# Patient Record
Sex: Male | Born: 1960 | Race: White | Hispanic: No | Marital: Married | Smoking: Never smoker
Health system: Southern US, Community
[De-identification: ages and names within clinical notes are randomized; demographics above are authoritative.]

## PROBLEM LIST (undated history)

## (undated) DIAGNOSIS — E785 Hyperlipidemia, unspecified: Secondary | ICD-10-CM

## (undated) DIAGNOSIS — Z8249 Family history of ischemic heart disease and other diseases of the circulatory system: Secondary | ICD-10-CM

## (undated) DIAGNOSIS — I10 Essential (primary) hypertension: Secondary | ICD-10-CM

## (undated) DIAGNOSIS — C44311 Basal cell carcinoma of skin of nose: Secondary | ICD-10-CM

## (undated) DIAGNOSIS — R079 Chest pain, unspecified: Secondary | ICD-10-CM

## (undated) DIAGNOSIS — A630 Anogenital (venereal) warts: Secondary | ICD-10-CM

## (undated) DIAGNOSIS — K429 Umbilical hernia without obstruction or gangrene: Secondary | ICD-10-CM

## (undated) DIAGNOSIS — K5520 Angiodysplasia of colon without hemorrhage: Secondary | ICD-10-CM

## (undated) DIAGNOSIS — G4730 Sleep apnea, unspecified: Secondary | ICD-10-CM

## (undated) HISTORY — DX: Basal cell carcinoma of skin of nose: C44.311

## (undated) HISTORY — DX: Angiodysplasia of colon without hemorrhage: K55.20

## (undated) HISTORY — DX: Chest pain, unspecified: R07.9

## (undated) HISTORY — PX: KNEE ARTHROSCOPY: SUR90

## (undated) HISTORY — DX: Essential (primary) hypertension: I10

## (undated) HISTORY — DX: Anogenital (venereal) warts: A63.0

## (undated) HISTORY — PX: TOOTH EXTRACTION: SUR596

## (undated) HISTORY — DX: Sleep apnea, unspecified: G47.30

## (undated) HISTORY — PX: COLONOSCOPY: SHX174

## (undated) HISTORY — DX: Hyperlipidemia, unspecified: E78.5

## (undated) HISTORY — PX: MOHS SURGERY: SUR867

## (undated) HISTORY — DX: Umbilical hernia without obstruction or gangrene: K42.9

## (undated) HISTORY — PX: MOUTH SURGERY: SHX715

## (undated) HISTORY — DX: Family history of ischemic heart disease and other diseases of the circulatory system: Z82.49

---

## 1985-05-12 HISTORY — PX: APPENDECTOMY: SHX54

## 2001-10-13 ENCOUNTER — Encounter: Payer: Self-pay | Admitting: Family Medicine

## 2001-10-13 ENCOUNTER — Encounter: Admission: RE | Admit: 2001-10-13 | Discharge: 2001-10-13 | Payer: Self-pay | Admitting: Family Medicine

## 2002-05-12 HISTORY — PX: HERNIA REPAIR: SHX51

## 2002-11-01 ENCOUNTER — Encounter: Payer: Self-pay | Admitting: Pulmonary Disease

## 2002-11-01 ENCOUNTER — Ambulatory Visit (HOSPITAL_BASED_OUTPATIENT_CLINIC_OR_DEPARTMENT_OTHER): Admission: RE | Admit: 2002-11-01 | Discharge: 2002-11-01 | Payer: Self-pay | Admitting: Family Medicine

## 2004-09-26 ENCOUNTER — Ambulatory Visit: Payer: Self-pay | Admitting: Family Medicine

## 2005-03-27 ENCOUNTER — Ambulatory Visit: Payer: Self-pay | Admitting: Family Medicine

## 2005-04-02 ENCOUNTER — Ambulatory Visit: Payer: Self-pay | Admitting: Family Medicine

## 2005-07-07 ENCOUNTER — Ambulatory Visit: Payer: Self-pay | Admitting: Family Medicine

## 2005-10-16 ENCOUNTER — Ambulatory Visit: Payer: Self-pay | Admitting: Family Medicine

## 2005-10-30 ENCOUNTER — Ambulatory Visit: Payer: Self-pay | Admitting: Internal Medicine

## 2005-11-24 ENCOUNTER — Ambulatory Visit: Payer: Self-pay | Admitting: Family Medicine

## 2005-12-04 ENCOUNTER — Ambulatory Visit: Payer: Self-pay | Admitting: Internal Medicine

## 2006-01-16 ENCOUNTER — Ambulatory Visit: Payer: Self-pay | Admitting: Family Medicine

## 2006-01-27 ENCOUNTER — Ambulatory Visit: Payer: Self-pay | Admitting: Family Medicine

## 2006-05-13 ENCOUNTER — Ambulatory Visit: Payer: Self-pay | Admitting: Family Medicine

## 2006-05-13 LAB — CONVERTED CEMR LAB
ALT: 52 units/L — ABNORMAL HIGH (ref 0–40)
AST: 45 units/L — ABNORMAL HIGH (ref 0–37)
Albumin: 4.2 g/dL (ref 3.5–5.2)
Alkaline Phosphatase: 52 units/L (ref 39–117)
Bilirubin, Direct: 0.2 mg/dL (ref 0.0–0.3)
Total Bilirubin: 1.8 mg/dL — ABNORMAL HIGH (ref 0.3–1.2)
Total Protein: 6.7 g/dL (ref 6.0–8.3)

## 2007-01-05 ENCOUNTER — Telehealth (INDEPENDENT_AMBULATORY_CARE_PROVIDER_SITE_OTHER): Payer: Self-pay | Admitting: *Deleted

## 2007-02-02 ENCOUNTER — Ambulatory Visit: Payer: Self-pay | Admitting: Family Medicine

## 2007-02-02 DIAGNOSIS — I10 Essential (primary) hypertension: Secondary | ICD-10-CM

## 2007-02-02 DIAGNOSIS — C44309 Unspecified malignant neoplasm of skin of other parts of face: Secondary | ICD-10-CM | POA: Insufficient documentation

## 2007-02-02 DIAGNOSIS — C44300 Unspecified malignant neoplasm of skin of unspecified part of face: Secondary | ICD-10-CM | POA: Insufficient documentation

## 2007-02-02 DIAGNOSIS — K429 Umbilical hernia without obstruction or gangrene: Secondary | ICD-10-CM | POA: Insufficient documentation

## 2007-02-02 DIAGNOSIS — E785 Hyperlipidemia, unspecified: Secondary | ICD-10-CM | POA: Insufficient documentation

## 2007-02-02 DIAGNOSIS — A630 Anogenital (venereal) warts: Secondary | ICD-10-CM

## 2007-02-02 LAB — CONVERTED CEMR LAB
Bilirubin Urine: NEGATIVE
Blood in Urine, dipstick: NEGATIVE
Glucose, Urine, Semiquant: NEGATIVE
Ketones, urine, test strip: NEGATIVE
Nitrite: NEGATIVE
Protein, U semiquant: NEGATIVE
Specific Gravity, Urine: 1.02
Urobilinogen, UA: NEGATIVE
WBC Urine, dipstick: NEGATIVE
pH: 5

## 2007-02-10 LAB — CONVERTED CEMR LAB
ALT: 37 units/L (ref 0–53)
AST: 34 units/L (ref 0–37)
Albumin: 4.3 g/dL (ref 3.5–5.2)
Alkaline Phosphatase: 55 units/L (ref 39–117)
BUN: 15 mg/dL (ref 6–23)
Basophils Absolute: 0.1 10*3/uL (ref 0.0–0.1)
Basophils Relative: 0.9 % (ref 0.0–1.0)
Bilirubin, Direct: 0.2 mg/dL (ref 0.0–0.3)
CO2: 28 meq/L (ref 19–32)
CRP, High Sensitivity: <1 — ABNORMAL LOW (ref 0.00–5.00)
Calcium: 9.2 mg/dL (ref 8.4–10.5)
Chloride: 109 meq/L (ref 96–112)
Cholesterol: 129 mg/dL (ref 0–200)
Creatinine, Ser: 0.8 mg/dL (ref 0.4–1.5)
Eosinophils Absolute: 0.2 10*3/uL (ref 0.0–0.6)
Eosinophils Relative: 2.2 % (ref 0.0–5.0)
GFR calc Af Amer: 133 mL/min
GFR calc non Af Amer: 110 mL/min
Glucose, Bld: 87 mg/dL (ref 70–99)
HCT: 44.8 % (ref 39.0–52.0)
HDL: 32.4 mg/dL — ABNORMAL LOW (ref >39.0)
Hemoglobin: 15.7 g/dL (ref 13.0–17.0)
Homocysteine: 10.9 micromoles/L (ref 5.00–13.90)
LDL Cholesterol: 73 mg/dL (ref 0–99)
Lymphocytes Relative: 29.3 % (ref 12.0–46.0)
MCHC: 35 g/dL (ref 30.0–36.0)
MCV: 93.8 fL (ref 78.0–100.0)
Monocytes Absolute: 0.7 10*3/uL (ref 0.2–0.7)
Monocytes Relative: 8 % (ref 3.0–11.0)
Neutro Abs: 4.9 10*3/uL (ref 1.4–7.7)
Neutrophils Relative %: 59.6 % (ref 43.0–77.0)
PSA: 0.64 ng/mL (ref 0.10–4.00)
Platelets: 197 10*3/uL (ref 150–400)
Potassium: 4.1 meq/L (ref 3.5–5.1)
RBC: 4.78 M/uL (ref 4.22–5.81)
RDW: 12.1 % (ref 11.5–14.6)
Sodium: 144 meq/L (ref 135–145)
TSH: 0.98 microintl units/mL (ref 0.35–5.50)
Total Bilirubin: 1.5 mg/dL — ABNORMAL HIGH (ref 0.3–1.2)
Total CHOL/HDL Ratio: 4
Total Protein: 6.9 g/dL (ref 6.0–8.3)
Triglycerides: 120 mg/dL (ref 0–149)
VLDL: 24 mg/dL (ref 0–40)
WBC: 8.3 10*3/uL (ref 4.5–10.5)

## 2007-02-11 ENCOUNTER — Encounter: Payer: Self-pay | Admitting: Family Medicine

## 2007-02-15 ENCOUNTER — Telehealth (INDEPENDENT_AMBULATORY_CARE_PROVIDER_SITE_OTHER): Payer: Self-pay | Admitting: *Deleted

## 2007-02-23 ENCOUNTER — Encounter: Payer: Self-pay | Admitting: Family Medicine

## 2007-02-23 ENCOUNTER — Ambulatory Visit: Payer: Self-pay

## 2007-03-19 ENCOUNTER — Telehealth (INDEPENDENT_AMBULATORY_CARE_PROVIDER_SITE_OTHER): Payer: Self-pay | Admitting: *Deleted

## 2007-06-22 ENCOUNTER — Ambulatory Visit: Payer: Self-pay | Admitting: Family Medicine

## 2007-07-02 LAB — CONVERTED CEMR LAB
ALT: 40 units/L (ref 0–53)
AST: 32 units/L (ref 0–37)
Albumin: 4 g/dL (ref 3.5–5.2)
Alkaline Phosphatase: 64 units/L (ref 39–117)
Bilirubin, Direct: 0.2 mg/dL (ref 0.0–0.3)
Cholesterol: 108 mg/dL (ref 0–200)
HDL: 29.2 mg/dL — ABNORMAL LOW (ref >39.0)
LDL Cholesterol: 54 mg/dL (ref 0–99)
Total Bilirubin: 0.7 mg/dL (ref 0.3–1.2)
Total CHOL/HDL Ratio: 3.7
Total Protein: 6.9 g/dL (ref 6.0–8.3)
Triglycerides: 122 mg/dL (ref 0–149)
VLDL: 24 mg/dL (ref 0–40)

## 2008-02-14 ENCOUNTER — Ambulatory Visit: Payer: Self-pay | Admitting: Family Medicine

## 2008-02-24 ENCOUNTER — Encounter (INDEPENDENT_AMBULATORY_CARE_PROVIDER_SITE_OTHER): Payer: Self-pay | Admitting: *Deleted

## 2008-04-05 ENCOUNTER — Telehealth (INDEPENDENT_AMBULATORY_CARE_PROVIDER_SITE_OTHER): Payer: Self-pay | Admitting: *Deleted

## 2009-02-12 ENCOUNTER — Encounter: Payer: Self-pay | Admitting: Family Medicine

## 2009-02-14 ENCOUNTER — Ambulatory Visit: Payer: Self-pay | Admitting: Family Medicine

## 2009-02-20 ENCOUNTER — Encounter: Payer: Self-pay | Admitting: Family Medicine

## 2009-02-27 ENCOUNTER — Encounter: Payer: Self-pay | Admitting: Family Medicine

## 2009-03-20 ENCOUNTER — Ambulatory Visit: Payer: Self-pay | Admitting: Family Medicine

## 2009-03-20 LAB — CONVERTED CEMR LAB
OCCULT 1: NEGATIVE
OCCULT 2: NEGATIVE
OCCULT 3: NEGATIVE

## 2009-05-25 ENCOUNTER — Ambulatory Visit: Payer: Self-pay | Admitting: Family Medicine

## 2009-06-04 LAB — CONVERTED CEMR LAB
ALT: 33 units/L (ref 0–53)
AST: 31 units/L (ref 0–37)
Albumin: 4.6 g/dL (ref 3.5–5.2)
Alkaline Phosphatase: 57 units/L (ref 39–117)
BUN: 19 mg/dL (ref 6–23)
Bilirubin, Direct: 0.1 mg/dL (ref 0.0–0.3)
CO2: 20 meq/L (ref 19–32)
Calcium: 10.1 mg/dL (ref 8.4–10.5)
Chloride: 105 meq/L (ref 96–112)
Cholesterol: 110 mg/dL (ref 0–200)
Creatinine, Ser: 0.83 mg/dL (ref 0.40–1.50)
Glucose, Bld: 93 mg/dL (ref 70–99)
HDL: 35 mg/dL — ABNORMAL LOW (ref >39)
Indirect Bilirubin: 0.9 mg/dL (ref 0.0–0.9)
LDL Cholesterol: 49 mg/dL (ref 0–99)
Potassium: 4.5 meq/L (ref 3.5–5.3)
Sodium: 141 meq/L (ref 135–145)
Total Bilirubin: 1 mg/dL (ref 0.3–1.2)
Total CHOL/HDL Ratio: 3.1
Total Protein: 7.3 g/dL (ref 6.0–8.3)
Triglycerides: 129 mg/dL (ref <150)
VLDL: 26 mg/dL (ref 0–40)

## 2010-02-25 ENCOUNTER — Encounter: Payer: Self-pay | Admitting: Family Medicine

## 2010-02-25 ENCOUNTER — Ambulatory Visit: Payer: Self-pay | Admitting: Family Medicine

## 2010-02-25 DIAGNOSIS — G4733 Obstructive sleep apnea (adult) (pediatric): Secondary | ICD-10-CM

## 2010-03-19 ENCOUNTER — Ambulatory Visit: Payer: Self-pay | Admitting: Family Medicine

## 2010-03-26 ENCOUNTER — Ambulatory Visit: Payer: Self-pay | Admitting: Pulmonary Disease

## 2010-04-07 ENCOUNTER — Encounter: Payer: Self-pay | Admitting: Pulmonary Disease

## 2010-04-07 ENCOUNTER — Ambulatory Visit (HOSPITAL_BASED_OUTPATIENT_CLINIC_OR_DEPARTMENT_OTHER)
Admission: RE | Admit: 2010-04-07 | Discharge: 2010-04-07 | Payer: Self-pay | Source: Home / Self Care | Admitting: Pulmonary Disease

## 2010-04-11 ENCOUNTER — Encounter: Payer: Self-pay | Admitting: Pulmonary Disease

## 2010-04-11 ENCOUNTER — Telehealth (INDEPENDENT_AMBULATORY_CARE_PROVIDER_SITE_OTHER): Payer: Self-pay | Admitting: *Deleted

## 2010-04-22 ENCOUNTER — Encounter: Payer: Self-pay | Admitting: Pulmonary Disease

## 2010-04-26 ENCOUNTER — Encounter: Payer: Self-pay | Admitting: Pulmonary Disease

## 2010-04-30 ENCOUNTER — Ambulatory Visit: Payer: Self-pay | Admitting: Pulmonary Disease

## 2010-05-02 ENCOUNTER — Encounter: Payer: Self-pay | Admitting: Pulmonary Disease

## 2010-05-26 ENCOUNTER — Encounter: Payer: Self-pay | Admitting: Pulmonary Disease

## 2010-06-04 ENCOUNTER — Ambulatory Visit: Admit: 2010-06-04 | Payer: Self-pay | Admitting: Pulmonary Disease

## 2010-06-07 ENCOUNTER — Ambulatory Visit
Admission: RE | Admit: 2010-06-07 | Discharge: 2010-06-07 | Payer: Self-pay | Source: Home / Self Care | Attending: Pulmonary Disease | Admitting: Pulmonary Disease

## 2010-06-09 LAB — CONVERTED CEMR LAB
ALT: 39 units/L (ref 0–53)
ALT: 44 units/L (ref 0–53)
AST: 35 units/L (ref 0–37)
AST: 38 units/L — ABNORMAL HIGH (ref 0–37)
Albumin: 4.4 g/dL (ref 3.5–5.2)
Albumin: 4.5 g/dL (ref 3.5–5.2)
Alkaline Phosphatase: 50 units/L (ref 39–117)
Alkaline Phosphatase: 53 units/L (ref 39–117)
BUN: 14 mg/dL (ref 6–23)
BUN: 16 mg/dL (ref 6–23)
Basophils Absolute: 0 10*3/uL (ref 0.0–0.1)
Basophils Absolute: 0 10*3/uL (ref 0.0–0.1)
Basophils Absolute: 0 10*3/uL (ref 0.0–0.1)
Basophils Relative: 0.3 % (ref 0.0–3.0)
Basophils Relative: 0.5 % (ref 0.0–3.0)
Basophils Relative: 0.6 % (ref 0.0–3.0)
Bilirubin Urine: NEGATIVE
Bilirubin, Direct: 0.1 mg/dL (ref 0.0–0.3)
Bilirubin, Direct: 0.2 mg/dL (ref 0.0–0.3)
Blood in Urine, dipstick: NEGATIVE
CO2: 29 meq/L (ref 19–32)
CO2: 30 meq/L (ref 19–32)
Calcium: 9.4 mg/dL (ref 8.4–10.5)
Calcium: 9.4 mg/dL (ref 8.4–10.5)
Chloride: 101 meq/L (ref 96–112)
Chloride: 106 meq/L (ref 96–112)
Cholesterol: 125 mg/dL (ref 0–200)
Creatinine, Ser: 0.8 mg/dL (ref 0.4–1.5)
Creatinine, Ser: 0.9 mg/dL (ref 0.4–1.5)
Eosinophils Absolute: 0.2 10*3/uL (ref 0.0–0.7)
Eosinophils Absolute: 0.2 10*3/uL (ref 0.0–0.7)
Eosinophils Absolute: 0.2 10*3/uL (ref 0.0–0.7)
Eosinophils Relative: 1.9 % (ref 0.0–5.0)
Eosinophils Relative: 2 % (ref 0.0–5.0)
Eosinophils Relative: 2.6 % (ref 0.0–5.0)
GFR calc Af Amer: 116 mL/min
GFR calc non Af Amer: 109.06 mL/min (ref >60)
GFR calc non Af Amer: 96 mL/min
Glucose, Bld: 109 mg/dL — ABNORMAL HIGH (ref 70–99)
Glucose, Bld: 90 mg/dL (ref 70–99)
Glucose, Urine, Semiquant: NEGATIVE
HCT: 44.8 % (ref 39.0–52.0)
HCT: 46 % (ref 39.0–52.0)
HCT: 46.2 % (ref 39.0–52.0)
HDL: 35.7 mg/dL — ABNORMAL LOW (ref >39.0)
Hemoglobin: 15.5 g/dL (ref 13.0–17.0)
Hemoglobin: 15.9 g/dL (ref 13.0–17.0)
Hemoglobin: 16.2 g/dL (ref 13.0–17.0)
Ketones, urine, test strip: NEGATIVE
LDL Cholesterol: 71 mg/dL (ref 0–99)
Lymphocytes Relative: 23.6 % (ref 12.0–46.0)
Lymphocytes Relative: 24.1 % (ref 12.0–46.0)
Lymphocytes Relative: 27.8 % (ref 12.0–46.0)
Lymphs Abs: 1.9 10*3/uL (ref 0.7–4.0)
Lymphs Abs: 2 10*3/uL (ref 0.7–4.0)
MCHC: 34.4 g/dL (ref 30.0–36.0)
MCHC: 34.5 g/dL (ref 30.0–36.0)
MCHC: 35.2 g/dL (ref 30.0–36.0)
MCV: 93.3 fL (ref 78.0–100.0)
MCV: 94.8 fL (ref 78.0–100.0)
MCV: 95.5 fL (ref 78.0–100.0)
Monocytes Absolute: 0.4 10*3/uL (ref 0.1–1.0)
Monocytes Absolute: 0.6 10*3/uL (ref 0.1–1.0)
Monocytes Absolute: 0.7 10*3/uL (ref 0.1–1.0)
Monocytes Relative: 4.2 % (ref 3.0–12.0)
Monocytes Relative: 7.9 % (ref 3.0–12.0)
Monocytes Relative: 8 % (ref 3.0–12.0)
Neutro Abs: 5.1 10*3/uL (ref 1.4–7.7)
Neutro Abs: 5.5 10*3/uL (ref 1.4–7.7)
Neutro Abs: 5.6 10*3/uL (ref 1.4–7.7)
Neutrophils Relative %: 64.9 % (ref 43.0–77.0)
Neutrophils Relative %: 65.4 % (ref 43.0–77.0)
Neutrophils Relative %: 66.2 % (ref 43.0–77.0)
Nitrite: NEGATIVE
PSA: 0.42 ng/mL (ref 0.10–4.00)
PSA: 0.96 ng/mL (ref 0.10–4.00)
PSA: 1.1 ng/mL (ref 0.10–4.00)
Platelets: 181 10*3/uL (ref 150.0–400.0)
Platelets: 197 10*3/uL (ref 150.0–400.0)
Platelets: 209 10*3/uL (ref 150–400)
Potassium: 4 meq/L (ref 3.5–5.1)
Potassium: 4.6 meq/L (ref 3.5–5.1)
Protein, U semiquant: NEGATIVE
RBC: 4.7 M/uL (ref 4.22–5.81)
RBC: 4.87 M/uL (ref 4.22–5.81)
RBC: 4.93 M/uL (ref 4.22–5.81)
RDW: 11.7 % (ref 11.5–14.6)
RDW: 11.9 % (ref 11.5–14.6)
RDW: 12.6 % (ref 11.5–14.6)
Sodium: 140 meq/L (ref 135–145)
Sodium: 142 meq/L (ref 135–145)
Specific Gravity, Urine: 1.025
TSH: 0.99 microintl units/mL (ref 0.35–5.50)
TSH: 1.1 microintl units/mL (ref 0.35–5.50)
TSH: 1.41 microintl units/mL (ref 0.35–5.50)
Total Bilirubin: 1.2 mg/dL (ref 0.3–1.2)
Total Bilirubin: 1.5 mg/dL — ABNORMAL HIGH (ref 0.3–1.2)
Total CHOL/HDL Ratio: 3.5
Total Protein: 7.1 g/dL (ref 6.0–8.3)
Total Protein: 7.1 g/dL (ref 6.0–8.3)
Triglycerides: 92 mg/dL (ref 0–149)
Urobilinogen, UA: NEGATIVE
VLDL: 18 mg/dL (ref 0–40)
WBC Urine, dipstick: NEGATIVE
WBC: 7.8 10*3/uL (ref 4.5–10.5)
WBC: 8.4 10*3/uL (ref 4.5–10.5)
WBC: 8.4 10*3/uL (ref 4.5–10.5)
pH: 5

## 2010-06-11 NOTE — Miscellaneous (Signed)
Summary: [facility]AP Supplies/[facility]  [facility] Supplies/[facility]   Imported By: Sherian Rein 04/17/2010 07:23:27  _____________________________________________________________________  External Attachment:    Type:   Image     Comment:   External Document

## 2010-06-11 NOTE — Progress Notes (Signed)
Summary: PSG results  Phone Note Outgoing Call   Call placed by: Comer Locket. Vassie Loll MD,  April 11, 2010 1:28 PM Reason for Call: Discuss lab or test results Summary of Call: pl let him know - PSG did show severe obstructive sleep apnea , CPAP Rx has been sent. Wil discuss more on FU visit CPAP 9 cm with medium full face mask, humidity, download in 4 weeks I called both nos listed Initial call taken by: Comer Locket. Vassie Loll MD,  April 11, 2010 1:32 PM  Follow-up for Phone Call        [facility] x 1. Zackery Barefoot CMA  April 11, 2010 1:59 PM   Spoke with pt and notified of results/recs per RA.  Pt verbalized understanding. Follow-up by: Vernie Murders,  April 11, 2010 2:38 PM

## 2010-06-11 NOTE — Assessment & Plan Note (Signed)
Summary: cpx/fasting/kn   Vital Signs:  Patient profile:   50 year old male Height:      70 inches Weight:      247.6 pounds BMI:     35.66 Temp:     98.1 degrees F oral Pulse rate:   60 / minute Pulse rhythm:   regular BP sitting:   138 / 90  (left arm) Cuff size:   large  Vitals Entered By: Almeta Monas CMA Duncan Dull) (February 25, 2010 8:43 AM) CC: cpx/fasting, flu shot given   History of Present Illness: Pt here for cpe and labs.  Pt states bp at home runs between 120-130/ 80-85.  Pt wife states pt is snoring louder and has had some episodes where he stops breathing.  Pt was dx with mild sleep apnea several years ago.  No other complaints.    Preventive Screening-Counseling & Management  Alcohol-Tobacco     Alcohol drinks/day: <1     Smoking Status: never  Caffeine-Diet-Exercise     Caffeine use/day: 1     Does Patient Exercise: yes     Type of exercise: walking     Exercise (avg: min/session): 30-60     Times/week: 4  Hep-HIV-STD-Contraception     HIV Risk: no     Dental Visit-last 6 months yes     Dental Care Counseling: not indicated; dental care within six months     Sun Exposure-Excessive: no  Safety-Violence-Falls     Seat Belt Use: 100      Sexual History:  currently monogamous and marriage.        Drug Use:  no.    Current Medications (verified): 1)  Toprol Xl 100 Mg  Tb24 (Metoprolol Succinate) .Marland Kitchen.. 1 By Mouth Once Daily 2)  Zocor 40 Mg  Tabs (Simvastatin) .Marland Kitchen.. 1 By Mouth At Bedtime 3)  Adult Aspirin Low Strength 81 Mg  Tbdp (Aspirin) .Marland Kitchen.. 1 By Mouth Once Daily 4)  Flaxseed Oil 1000 Mg  Caps (Flaxseed (Linseed)) .... 2 By Mouth Qam  , 1 By Mouth Qpm  Allergies (verified): No Known Drug Allergies  Past History:  Past Medical History: Last updated: 02/02/2007  FAMILY HISTORY OF CAD MALE 1ST DEGREE RELATIVE <50 (ICD-V17.3) BASAL CELL CARCINOMA, NOSE (ICD-173.3) HERNIA, UMBILICAL (ICD-553.1) VENEREAL WART (ICD-078.11) HYPERTENSION  (ICD-401.9) HYPERLIPIDEMIA (ICD-272.4)  Past Surgical History: Last updated: 02/02/2007 Appendectomy umbilical hernia Mohs surgery--nose wisdom teeth  Family History: Last updated: 02/02/2007 Family History of CAD Male 1st degree relative 28 F died at 101 of MI--- first one at 34 MGF-- died in 4s of mi Family History of Skin cancer  Social History: Last updated: 02/14/2008 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 Family History of CAD Male 1st degree relative 60 F died at 89 of MI--- first one at 66 MGF-- died in 25s of mi Family History of Skin cancer  Social History: Reviewed history from 02/14/2008 and no changes required. 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 Eyes:  Denies blurring, discharge, double vision, eye irritation, eye pain,  halos, itching, light sensitivity, red eye, vision loss-1 eye, and vision loss-both eyes; optho--due. ENT:  Denies decreased hearing, difficulty swallowing, ear discharge, earache, hoarseness, nasal congestion, nosebleeds, postnasal drainage, ringing in ears, sinus pressure, and sore throat. CV:  Denies bluish discoloration of lips or nails, chest pain or discomfort, difficulty breathing at night, difficulty breathing while lying down, fainting, fatigue, leg cramps with exertion, lightheadness, near fainting, palpitations, shortness of breath with exertion, swelling of feet,  swelling of hands, and weight gain. Resp:  Denies chest discomfort, chest pain with inspiration, cough, coughing up blood, excessive snoring, hypersomnolence, morning headaches, pleuritic, shortness of breath, sputum productive, and wheezing. GI:  Denies abdominal pain, bloody stools, change in bowel habits, constipation, dark tarry stools, diarrhea, excessive appetite, gas, hemorrhoids, indigestion, loss of appetite, and nausea. GU:  Denies decreased libido, discharge, dysuria, erectile dysfunction, genital sores, hematuria, incontinence, nocturia, urinary frequency, and urinary hesitancy. MS:  Denies joint pain, joint redness, joint swelling, loss of strength, low back pain, mid back pain, muscle aches, muscle , cramps, muscle weakness, stiffness, and thoracic pain. Derm:  Denies changes in color of skin, changes in nail beds, dryness, excessive perspiration, flushing, hair loss, insect bite(s), itching, lesion(s), poor wound healing, and rash. Neuro:  Denies brief paralysis, difficulty with concentration, disturbances in coordination, falling down, headaches, inability to speak, memory loss, numbness, poor balance, seizures, sensation of room spinning, tingling, tremors, visual disturbances, and weakness. Psych:  Denies alternate hallucination ( auditory/visual), anxiety, depression, easily angered, easily tearful, irritability, mental problems, panic attacks, sense of great danger, suicidal thoughts/plans, thoughts of violence, unusual visions or sounds, and thoughts /plans of harming others. Endo:  Denies cold intolerance, excessive hunger, excessive thirst, excessive urination, heat intolerance, polyuria, and weight change. Heme:  Denies abnormal bruising, bleeding, enlarge lymph nodes, fevers, pallor, and skin discoloration. Allergy:  Denies hives or rash, itching eyes, persistent infections, seasonal allergies, and sneezing.  Physical Exam  General:  Well-developed,well-nourished,in no acute  distress; alert,appropriate and cooperative throughout examination Head:  Normocephalic and atraumatic without obvious abnormalities. No apparent alopecia or balding. Eyes:  pupils equal, pupils round, pupils reactive to light, and no injection.   Ears:  External ear exam shows no significant lesions or deformities.  Otoscopic examination reveals clear canals, tympanic membranes are intact bilaterally without bulging, retraction, inflammation or discharge. Hearing is grossly normal bilaterally. Nose:  External nasal examination shows no deformity or inflammation. Nasal mucosa are pink and moist without lesions or exudates. Mouth:  Oral mucosa and oropharynx without lesions or exudates.  Teeth in good repair. Neck:  No deformities, masses, or tenderness noted. Chest Wall:  No deformities, masses, tenderness or gynecomastia noted. Lungs:  Normal respiratory effort, chest expands symmetrically. Lungs are clear to auscultation, no crackles or wheezes. Heart:  normal rate and no murmur.   Abdomen:  Bowel sounds positive,abdomen soft and non-tender without masses, organomegaly or hernias noted. Rectal:  No external abnormalities noted. Normal sphincter tone. No rectal masses or tenderness. Genitalia:  Testes bilaterally descended without nodularity, tenderness or masses. No scrotal masses or lesions. No penis lesions or urethral discharge. Prostate:  Prostate gland firm and smooth, no enlargement, nodularity, tenderness, mass, asymmetry or induration. Msk:  normal ROM, no joint tenderness, no joint swelling, no joint warmth, no redness over joints, no joint deformities, no joint instability, and no crepitation.   Pulses:  R posterior tibial normal, R dorsalis pedis normal, R carotid normal, L posterior tibial normal, L dorsalis pedis normal, and L carotid normal.  Extremities:  No clubbing, cyanosis, edema, or deformity noted with normal full range of motion of all joints.   Neurologic:  No cranial nerve  deficits noted. Station and gait are normal. Plantar reflexes are down-going bilaterally. DTRs are symmetrical throughout. Sensory, motor and coordinative functions appear intact. Skin:  Intact without suspicious lesions or rashes Cervical Nodes:  No lymphadenopathy noted Axillary Nodes:  No palpable lymphadenopathy Psych:  Cognition and judgment appear intact. Alert and cooperative with normal attention span and concentration. No apparent delusions, illusions, hallucinations   Impression & Recommendations:  Problem # 1:  PREVENTIVE HEALTH CARE (ICD-V70.0)  Orders: Venipuncture (57846) TLB-CBC Platelet - w/Differential (85025-CBCD) TLB-TSH (Thyroid Stimulating Hormone) (84443-TSH) TLB-PSA (Prostate Specific Antigen) (84153-PSA) T- * Misc. Laboratory test [phone]) Specimen Handling (28413) EKG w/ Interpretation (93000)  Reviewed preventive care protocols, scheduled due services, and updated immunizations.  Problem # 2:  SLEEP APNEA (ICD-780.57)  Orders: Sleep Disorder Referral (Sleep Disorder) EKG w/ Interpretation (93000)  CPAP (cm H20):   Mask Type:   Mask Size:   Problem # 3:  HYPERTENSION (ICD-401.9)  His updated medication list for this problem includes:    Toprol Xl 100 Mg Tb24 (Metoprolol succinate) .Marland Kitchen... 1 by mouth once daily  Orders: Venipuncture (24401) TLB-CBC Platelet - w/Differential (85025-CBCD) TLB-TSH (Thyroid Stimulating Hormone) (84443-TSH) TLB-PSA (Prostate Specific Antigen) (84153-PSA) T- * Misc. Laboratory test [phone]) Specimen Handling (36644) EKG w/ Interpretation (93000)  BP today: 138/90 Prior BP: 134/82 (02/14/2009)  Prior 10 Yr Risk Heart Disease: Not enough information (02/02/2007)  Labs Reviewed: K+: 4.5 (05/25/2009) Creat: : 0.83 (05/25/2009)   Chol: 110 (05/25/2009)   HDL: 35 (05/25/2009)   LDL: 49 (05/25/2009)   TG: 129 (05/25/2009)  Problem # 4:  HYPERLIPIDEMIA (ICD-272.4) Assessment: Unchanged  His updated medication list for  this problem includes:    Zocor 40 Mg Tabs (Simvastatin) .Marland Kitchen... 1 by mouth at bedtime  Orders: Venipuncture (03474) TLB-CBC Platelet - w/Differential (85025-CBCD) TLB-TSH (Thyroid Stimulating Hormone) (84443-TSH) TLB-PSA (Prostate Specific Antigen) (84153-PSA) T- * Misc. Laboratory test [phone]) Specimen Handling (38756) EKG w/ Interpretation (93000)  Labs Reviewed: SGOT: 31 (05/25/2009)   SGPT: 33 (05/25/2009)  Prior 10 Yr Risk Heart Disease: Not enough information (02/02/2007)   HDL:35 (05/25/2009), 35.7 (02/14/2008)  LDL:49 (05/25/2009), 71 (02/14/2008)  Chol:110 (05/25/2009), 125 (02/14/2008)  Trig:129 (05/25/2009), 92 (02/14/2008)  Complete Medication List: 1)  Toprol Xl 100 Mg Tb24 (Metoprolol succinate) .Marland Kitchen.. 1 by mouth once daily 2)  Zocor 40 Mg Tabs (Simvastatin) .Marland Kitchen.. 1 by mouth at bedtime 3)  Adult Aspirin Low Strength 81 Mg Tbdp (Aspirin) .Marland Kitchen.. 1 by mouth once daily 4)  Flaxseed Oil 1000 Mg Caps (Flaxseed (linseed)) .... 2 by mouth qam  , 1 by mouth qpm 5)  Multivitamins Caps (Multiple vitamin) .Marland Kitchen.. 1 by mouth two times a day  Other Orders: Admin 1st Vaccine (43329) Flu Vaccine 32yrs + (51884)  Patient Instructions: 1)  Please schedule a follow-up appointment in 2 weeks to recheck bp and go over labs 2)  call your insurance about a shingles vaccine Prescriptions: ZOCOR 40 MG  TABS (SIMVASTATIN) 1 by mouth at bedtime  #90 x 3   Entered and Authorized by:   Loreen Freud DO   Signed by:   Loreen Freud DO on 02/25/2010   Method used:   Faxed to ...       [facility] (mail-order)       [address]       [place], [place]  [address]  Ph: [phone]       Fax: [phone]   RxID:   [id number] TOPROL XL 100 MG  TB24 (METOPROLOL SUCCINATE) 1 by mouth once daily  #90 x 3   Entered and Authorized by:   Loreen Freud DO   Signed by:   Loreen Freud DO on 02/25/2010   Method used:   Faxed to ...       [facility] (mail-order)       [address]       [place], [place]   [address]       Ph: [phone]       Fax: [phone]   RxID:   [id number]  Flu Vaccine Consent Questions     Do you have a history of severe allergic reactions to this vaccine? no    Any prior history of allergic reactions to egg and/or gelatin? no    Do you have a sensitivity to the preservative Thimersol? no    Do you have a past history of Guillan-Barre Syndrome? no    Do you currently have an acute febrile illness? no    Have you ever had a severe reaction to latex? no    Vaccine information given and explained to patient? yes    Are you currently pregnant? no    Lot Number:[id number]   Exp Date:11/09/2010   Site Given  Right Deltoid IM  Orders Added: 1)  Admin 1st Vaccine [90471] 2)  Flu Vaccine 64yrs + [90658] 3)  Sleep Disorder Referral [Sleep Disorder] 4)  Venipuncture [32951] 5)  TLB-CBC Platelet - w/Differential [85025-CBCD] 6)  TLB-TSH (Thyroid Stimulating Hormone) [84443-TSH] 7)  TLB-PSA (Prostate Specific Antigen) [84153-PSA] 8)  T- * Misc. Laboratory test [99999] 9)  Specimen Handling [99000] 10)  Est. Patient 40-64 years [99396] 11)  EKG w/ Interpretation [93000]   .[email or web address]  Last Flu Vaccine:  Fluvax 3+ (02/14/2009 8:31:18 AM) Flu Vaccine Result Date:  02/25/2010 Flu Vaccine Result:  given Flu Vaccine Next Due:  1 yr  Appended Document: cpx/fasting/kn  Laboratory Results   Urine Tests   Date/Time Reported: February 25, 2010 11:00 AM   Routine Urinalysis   Color: yellow Appearance: Clear Glucose: negative   (Normal Range: Negative) Bilirubin: negative   (Normal Range: Negative) Ketone: negative   (Normal Range: Negative) Spec. Gravity: 1.025   (Normal Range: 1.003-1.035) Blood: negative   (Normal Range: Negative) pH: 5.0   (Normal Range: 5.0-8.0) Protein: negative   (Normal Range: Negative) Urobilinogen: negative   (Normal Range: 0-1) Nitrite: negative   (Normal Range: Negative) Leukocyte Esterace: negative   (Normal Range: Negative)     Comments: Floydene Flock  February 25, 2010 11:00 AM

## 2010-06-11 NOTE — Assessment & Plan Note (Signed)
Summary: SLEEP APNEA- [facility] OFFICE PER RENE//KP   Visit Type:  Initial Consult Copy to:  pcp Primary Provider/Referring Provider:  Laury Axon  CC:  Sleep consult.  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 He had a sleep evaluation 5 yrs ago at [facility] was told then that he did not need treatment. He has gained 15 lbs since then & his wife has noted loud snoring & witnessed apneas. Bedtime is 1030-1130 p, sleep latency few mins, 1 awakening for nocturia, oob by 0600 on weekday & 0800 on weekends, feels tired. Sleeps on his sid eor stomach , wakes up on his back, worse snoring on his back. Drinks coke zero  1-2 cans/ day espp if driving. Epworth Sleepiness Score is 18 - esp afternoons or reading or as a passenger in a car. There is no history suggestive of cataplexy, sleep paralysis or parasomnias   Preventive Screening-Counseling & Management  Alcohol-Tobacco     Alcohol drinks/day: <1     Alcohol type: wine      Smoking Status: never   History of Present Illness: Loud snoring, stop breathing and still feel tired in the morning. Typically do not remember any dreams  What time do you typically go to bed?(between what hours): 10:30-11:30  How long does it take you to fall asleep? 5-10 minutes  How many times during the night do you wake up? 0-1  What time do you get out of bed to start your day? 5:30-6:30  Do you drive or operate heavy machinery in your occupation? n/a  How much has your weight changed (up or down) over the past two years? (in pounds): up 10-5 lbs  Have you ever had a sleep study before?  If yes,when and where: [facility] 5 to 6 years ago  Do you currently use CPAP ? If so , at what pressure? no  Do you wear oxygen at any time? If yes, how many liters per minute? no Current Medications (verified): 1)  Toprol Xl 100 Mg  Tb24 (Metoprolol Succinate) .Marland Kitchen.. 1 By Mouth Once Daily 2)  Simcor 500-40 Mg Xr24h-Tab  (Niacin-Simvastatin) .Marland Kitchen.. 1 By Mouth At Bedtime 3)  Adult Aspirin Low Strength 81 Mg  Tbdp (Aspirin) .Marland Kitchen.. 1 By Mouth Once Daily 4)  Flaxseed Oil 1000 Mg  Caps (Flaxseed (Linseed)) .... 2 By Mouth Qam  , 1 By Mouth Qpm 5)  Multivitamins  Caps (Multiple Vitamin) .Marland Kitchen.. 1 By Mouth Two Times A Day 6)  Simcor 1000-40 Mg Xr24h-Tab (Niacin-Simvastatin) .Marland Kitchen.. 1 By Mouth At Bedtime  Allergies (verified): No Known Drug Allergies  Past History:  Past Medical History: Last updated: 02/02/2007  FAMILY HISTORY OF CAD MALE 1ST DEGREE RELATIVE <50 (ICD-V17.3) BASAL CELL CARCINOMA, NOSE (ICD-173.3) HERNIA, UMBILICAL (ICD-553.1) VENEREAL WART (ICD-078.11) HYPERTENSION (ICD-401.9) HYPERLIPIDEMIA (ICD-272.4)  Past Surgical History: Last updated: 02/02/2007 Appendectomy umbilical hernia Mohs surgery--nose wisdom teeth  Family History: Last updated: 02/02/2007 Family History of CAD Male 1st degree relative 61 F died at 24 of MI--- first one at 50 MGF-- died in 14s of mi Family History of Skin cancer  Social History: Last updated: 02/14/2008 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  The patient denies shortness of breath with activity, shortness of breath at rest, productive cough, non-productive cough, coughing up blood, chest pain,  irregular heartbeats, loss of appetite, weight change, abdominal pain, difficulty swallowing, sore throat, tooth/dental problems, headaches, nasal congestion/difficulty breathing through nose, sneezing, itching, ear ache, anxiety, depression, hand/feet swelling, joint stiffness or pain, rash, change in color of mucus, and fever.    Vital Signs:  Patient profile:   50 year old male Height:      70 inches Weight:      246 pounds BMI:     35.42 O2 Sat:      97 % on Room air Temp:     98.3 degrees F oral Pulse  rate:   56 / minute BP sitting:   128 / 82  (left arm) Cuff size:   large  Vitals Entered By: Zackery Barefoot CMA (March 26, 2010 9:07 AM)  O2 Flow:  Room air CC: Sleep consult Comments Medications reviewed with patient Verified contact number and pharmacy with patient Zackery Barefoot [facility]  March 26, 2010 9:08 AM    Physical Exam  Additional Exam:  wt 246 March 26, 2010  Gen. Pleasant, well-nourished, in no distress, normal affect ENT - no lesions, no post nasal drip, class 3 airway Neck: No JVD, no thyromegaly, no carotid bruits Lungs: no use of accessory muscles, no dullness to percussion, clear without rales or rhonchi  Cardiovascular: Rhythm regular, heart sounds  normal, no murmurs or gallops, no peripheral edema Abdomen: soft and non-tender, no hepatosplenomegaly, BS normal. Musculoskeletal: No deformities, no cyanosis or clubbing Neuro:  alert, non focal     Impression & Recommendations:  Problem # 1:  SLEEP APNEA (ICD-780.57) The pathophysiology of obstructive sleep apnea, it's cardiovascular consequences and modes of treatment including CPAP were discussed with the patient in great detail.  Given weight gain since last study, excessive daytime somnolence & fatigue & witnessde apneas, obstructive sleep apnea is very likley & an overnight PSG will be scheduled as a split study. Orders: Consultation Level III (16109) Sleep Disorder Referral (Sleep Disorder)  Patient Instructions: 1)  Please schedule a follow-up appointment in 2 weeks after sleep study 2)  Copy sent to: Dr Laury Axon    Appended Document: SLEEP APNEA- [facility] OFFICE PER RENE//KP reviewed PSG 11/01/02 > RDI 12/h with predom obs apneas, lowest desatn 88% c/w mild obstructive sleep apnea , large no of leg jerks with minimal sleep disruption - d/w pt

## 2010-06-11 NOTE — Assessment & Plan Note (Signed)
Summary: 2 week roa about lab results//lch   Vital Signs:  Patient profile:   50 year old male Weight:      247.6 pounds Pulse rate:   60 / minute Pulse rhythm:   regular BP sitting:   116 / 80  (right arm) Cuff size:   large  Vitals Entered By: Almeta Monas CMA Duncan Dull) (March 19, 2010 9:39 AM) CC: Review [facility]   History of Present Illness: Pt here to go over labs only     Current Medications (verified): 1)  Toprol Xl 100 Mg  Tb24 (Metoprolol Succinate) .Marland Kitchen.. 1 By Mouth Once Daily 2)  Simcor 500-40 Mg Xr24h-Tab (Niacin-Simvastatin) .Marland Kitchen.. 1 By Mouth At Bedtime 3)  Adult Aspirin Low Strength 81 Mg  Tbdp (Aspirin) .Marland Kitchen.. 1 By Mouth Once Daily 4)  Flaxseed Oil 1000 Mg  Caps (Flaxseed (Linseed)) .... 2 By Mouth Qam  , 1 By Mouth Qpm 5)  Multivitamins  Caps (Multiple Vitamin) .Marland Kitchen.. 1 By Mouth Two Times A Day 6)  Simcor 1000-40 Mg Xr24h-Tab (Niacin-Simvastatin) .Marland Kitchen.. 1 By Mouth At Bedtime  Allergies (verified): No Known Drug Allergies  Past History:  Past medical, surgical, family and social histories (including risk factors) reviewed for relevance to current acute and chronic problems.  Past Medical History: Reviewed history from 02/02/2007 and no changes required.  FAMILY HISTORY OF CAD MALE 1ST DEGREE RELATIVE <50 (ICD-V17.3) BASAL CELL CARCINOMA, NOSE (ICD-173.3) HERNIA, UMBILICAL (ICD-553.1) VENEREAL WART (ICD-078.11) HYPERTENSION (ICD-401.9) HYPERLIPIDEMIA (ICD-272.4)  Past Surgical History: Reviewed history from 02/02/2007 and no changes required. Appendectomy umbilical hernia Mohs surgery--nose wisdom teeth  Family History: Reviewed history from 02/02/2007 and no changes required. Family History of CAD Male 1st degree relative 28 F died at 3 of MI--- first one at 43 MGF-- died in 74s of mi Family History of Skin cancer  Social History: Reviewed history from 02/14/2008 and no changes required. 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     Impression & Recommendations:  Problem # 1:  HYPERLIPIDEMIA (ICD-272.4)  His updated medication list for this problem includes:    Simcor 500-40 Mg Xr24h-tab (Niacin-simvastatin) .Marland Kitchen... 1 by mouth at bedtime    Simcor 1000-40 Mg Xr24h-tab (Niacin-simvastatin) .Marland Kitchen... 1 by mouth at bedtime  Labs Reviewed: SGOT: 31 (05/25/2009)   SGPT: 33 (05/25/2009)  Prior 10 Yr Risk Heart Disease: Not enough information (02/02/2007)   HDL:35 (05/25/2009), 35.7 (02/14/2008)  LDL:49 (05/25/2009), 71 (02/14/2008)  Chol:110 (05/25/2009), 125 (02/14/2008)  Trig:129 (05/25/2009), 92 (02/14/2008)  Problem # 2:  HYPERTENSION (ICD-401.9)  His updated medication list for this problem includes:    Toprol Xl 100 Mg Tb24 (Metoprolol succinate) .Marland Kitchen... 1 by mouth once daily  BP today: 116/80 Prior BP: 138/90 (02/25/2010)  Prior 10 Yr Risk Heart Disease: Not enough information (02/02/2007)  Labs Reviewed: K+: 4.5 (05/25/2009) Creat: : 0.83 (05/25/2009)   Chol: 110 (05/25/2009)   HDL: 35 (05/25/2009)   LDL: 49 (05/25/2009)   TG: 129 (05/25/2009)  Complete Medication List: 1)  Toprol Xl 100 Mg Tb24 (Metoprolol succinate) .Marland Kitchen.. 1 by mouth once daily 2)  Simcor 500-40 Mg Xr24h-tab (Niacin-simvastatin) .Marland Kitchen.. 1 by mouth at bedtime 3)  Adult Aspirin Low Strength 81 Mg Tbdp (Aspirin) .Marland Kitchen.. 1 by mouth once daily  4)  Flaxseed Oil 1000 Mg Caps (Flaxseed (linseed)) .... 2 by mouth qam  , 1 by mouth qpm 5)  Multivitamins Caps (Multiple vitamin) .Marland Kitchen.. 1 by mouth two times a day 6)  Simcor 1000-40 Mg Xr24h-tab (Niacin-simvastatin) .Marland Kitchen.. 1 by mouth at bedtime  Patient Instructions: 1)  RTO 3 months ---272.4 [facility] Prescriptions: SIMCOR 1000-40 MG XR24H-TAB (NIACIN-SIMVASTATIN) 1 by mouth at bedtime  #30 x 1   Entered and Authorized by:   Loreen Freud DO   Signed by:   Loreen Freud DO on 03/19/2010   Method used:   Print then Give to Patient   RxID:   [id number]    Orders Added: 1)  Est. Patient Level III [14782]

## 2010-06-13 NOTE — Assessment & Plan Note (Signed)
Summary: ROV/KLW   Visit Type:  Follow-up Copy to:  pcp Primary Provider/Referring Provider:  Laury Axon  CC:  Follow up .  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 He had a sleep evaluation 2004 (205 lbs )- AHI was 12/ ,  did not need treatment. He has gained 35 lbs since then & his wife  noted loud snoring & witnessed apneas. PSG (240 lbs ) >> AHI 22/h , lowest desatn 87%, PLMs disappeared with PAP. Central apneas emerged at 11 cm & persisted on BiPAP 13/9.  June 07, 2010 3:03 PM  On CPAP since dec'11, Increased from 9 to 10 cm for snoring Download >> AHI 6/h ,excellent compliance, occasional leak Tolerating CPAP well, full face mask - pressure ok, mask ok, no dryness No Fatigue or  sleepiness There is no history suggestive of cataplexy, sleep paralysis or parasomnias   Preventive Screening-Counseling & Management  Alcohol-Tobacco     Alcohol drinks/day: <1     Alcohol type: wine      Smoking Status: never  Allergies: No Known Drug Allergies  Past History:  Past Medical History: Last updated: 02/02/2007  FAMILY HISTORY OF CAD MALE 1ST DEGREE RELATIVE <50 (ICD-V17.3) BASAL CELL CARCINOMA, NOSE (ICD-173.3) HERNIA, UMBILICAL (ICD-553.1) VENEREAL WART (ICD-078.11) HYPERTENSION (ICD-401.9) HYPERLIPIDEMIA (ICD-272.4)  Social History: Last updated: 02/14/2008 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     Vital Signs:  Patient profile:   50 year old male Height:      70 inches Weight:      250 pounds BMI:     36.00 O2 Sat:      95 % on Room air Temp:     98.3 degrees F oral Pulse rate:   65 / minute BP sitting:   110 / 74  (left arm) Cuff size:   large  Vitals Entered By: Zackery Barefoot CMA (June 07, 2010 2:41 PM)  O2 Flow:  Room air CC: Follow up  Comments Medications reviewed with patient Verified contact number and pharmacy with patient Zackery Barefoot [facility]  June 07, 2010 2:42 PM    Physical Exam  Additional Exam:  wt 246 March 26, 2010  250 April 30, 2010  Gen. Pleasant, well-nourished, in no distress, normal affect ENT - no lesions, no post nasal drip, class 3 airway Neck: No JVD, no thyromegaly, no carotid bruits Lungs: no use of accessory muscles, no dullness to percussion, clear without rales or rhonchi  Cardiovascular: Rhythm regular, heart sounds  normal, no murmurs or gallops, no peripheral edema Musculoskeletal: No deformities, no cyanosis or clubbing      Impression & Recommendations:  Problem # 1:  SLEEP APNEA (ICD-780.57)  Acceptable results on 10 cm Compliance encouraged, wt loss emphasized, asked to avoid meds with sedative side effects, cautioned against driving when sleepy.  Orders: Est. Patient Level III (16109)  Patient Instructions: 1)  Copy sent to: 2)  Please schedule a follow-up appointment in 9 months.

## 2010-06-13 NOTE — Assessment & Plan Note (Addendum)
Summary: post sleep study in [place]//jwr   Visit Type:  Follow-up Copy to:  pcp Primary Provider/Referring Provider:  Laury Axon  CC:  Post sleep study . Pt has CPAP and is still snoring and waking up with dry mouth.  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 He had a sleep evaluation 2004 (205 lbs )- AHI was 12/ ,  did not need treatment. He has gained 35 lbs since then & his wife has noted loud snoring & witnessed apneas. Bedtime is 1030-1130 p, sleep latency few mins, 1 awakening for nocturia, oob by 0600 on weekday & 0800 on weekends, feels tired. Sleeps on his sid eor stomach , wakes up on his back, worse snoring on his back. Drinks coke zero  1-2 cans/ day espp if driving. Epworth Sleepiness Score is 18 - esp afternoons or reading or as a passenger in a car.  April 30, 2010 2:30 PM  reviewed PSG (240 lbs ) >> AHI 22/h , lowest desatn 87%, PLMs disappeared with PAP. Central apneas emerged at 11 cm & persisted on BiPAP 13/9. Tolerating CPAP 9 cm well, full face mask - pressure ok, mask ok, some dryness, increased humidity setting, wife noted snoring last 2 nights. Fatigue still present but no sleepiness There is no history suggestive of cataplexy, sleep paralysis or parasomnias   Preventive Screening-Counseling & Management  Alcohol-Tobacco     Alcohol drinks/day: <1     Alcohol type: wine      Smoking Status: never  Current Medications (verified): 1)  Toprol Xl 100 Mg  Tb24 (Metoprolol Succinate) .Marland Kitchen.. 1 By Mouth Once Daily 2)  Adult Aspirin Low Strength 81 Mg  Tbdp (Aspirin) .Marland Kitchen.. 1 By Mouth Once Daily 3)  Flaxseed Oil 1000 Mg  Caps (Flaxseed (Linseed)) .... Take 2 Tablet By Mouth Two Times A Day 4)  Multivitamins  Caps (Multiple Vitamin) .... Take 1 Tablet By Mouth Once A Day 5)  Simcor 1000-40 Mg Xr24h-Tab (Niacin-Simvastatin) .Marland Kitchen.. 1 By Mouth At Bedtime 6)  Cpap .... 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    Vital Signs:  Patient profile:   50 year old male Height:      70 inches Weight:      250 pounds BMI:     36.00 O2 Sat:      96 % on Room air Temp:     98.5 degrees F oral Pulse rate:   62 / minute BP sitting:   118 / 78  (left arm) Cuff size:   large  Vitals Entered By: Zackery Barefoot CMA (April 30, 2010 2:14 PM)  O2 Flow:  Room air CC: Post sleep study . Pt has CPAP and is still snoring and waking up with dry mouth Comments Medications reviewed with patient Verified contact number and  pharmacy with patient Zackery Barefoot [facility]  April 30, 2010 2:14 PM    Physical Exam  Additional Exam:  wt 246 March 26, 2010  250 April 30, 2010 2:54 PM  Gen. Pleasant, well-nourished, in no distress, normal affect ENT - no lesions, no post nasal drip, class 3 airway Neck: No JVD, no thyromegaly, no carotid bruits Lungs: no use of accessory muscles, no dullness to percussion, clear without rales or rhonchi  Cardiovascular: Rhythm regular, heart sounds  normal, no murmurs or  gallops, no peripheral edema Musculoskeletal: No deformities, no cyanosis or clubbing      Impression & Recommendations:  Problem # 1:  SLEEP APNEA (ICD-780.57) Increase to 10 cm, chk download  Compliance encouraged, wt loss emphasized, asked to avoid meds with sedative side effects, cautioned against driving when sleepy.  Weight loss of 35- 4 lbs advised. Orders: Est. Patient Level III (16109) DME Referral (DME)  Medications Added to Medication List This Visit: 1)  Flaxseed Oil 1000 Mg Caps (Flaxseed (linseed)) .... Take 2 tablet by mouth two times a day 2)  Multivitamins Caps (Multiple vitamin) .... Take 1 tablet by mouth once a day 3)  Cpap  .... [facility]  Patient Instructions: 1)  Copy sent to:  2)  Please schedule a follow-up appointment in 1 month. 3)  We will give you feedback after you submit the chip    Appended Document: post sleep study in [facility]//jwr download 1/6-1/15/12 >> good compliance, pressure ok, no leak  Appended Document: post sleep study in [place]//jwr lmovmtcb x 1  Appended Document: post sleep study in [place]//jwr pt informed of same at 06/07/10 appointment with RA

## 2010-06-13 NOTE — Miscellaneous (Signed)
Summary: Setup CPAP/[facility]  Setup CPAP/[facility]   Imported By: Lester Beechwood 05/16/2010 08:54:59  _____________________________________________________________________  External Attachment:    Type:   Image     Comment:   External Document

## 2010-06-13 NOTE — Letter (Signed)
Summary: LMN for CPAP/[facility]  LMN for CPAP/[facility]   Imported By: Sherian Rein 05/15/2010 11:19:47  _____________________________________________________________________  External Attachment:    Type:   Image     Comment:   External Document

## 2010-06-17 ENCOUNTER — Encounter (INDEPENDENT_AMBULATORY_CARE_PROVIDER_SITE_OTHER): Payer: Self-pay | Admitting: *Deleted

## 2010-06-17 ENCOUNTER — Other Ambulatory Visit (INDEPENDENT_AMBULATORY_CARE_PROVIDER_SITE_OTHER): Payer: Managed Care, Other (non HMO)

## 2010-06-17 DIAGNOSIS — E785 Hyperlipidemia, unspecified: Secondary | ICD-10-CM

## 2010-07-08 ENCOUNTER — Ambulatory Visit: Payer: Self-pay | Admitting: Pulmonary Disease

## 2010-07-29 ENCOUNTER — Encounter: Payer: Self-pay | Admitting: Family Medicine

## 2010-07-29 ENCOUNTER — Ambulatory Visit (INDEPENDENT_AMBULATORY_CARE_PROVIDER_SITE_OTHER): Payer: Managed Care, Other (non HMO) | Admitting: Family Medicine

## 2010-07-29 ENCOUNTER — Ambulatory Visit: Payer: Managed Care, Other (non HMO) | Admitting: Family Medicine

## 2010-07-29 DIAGNOSIS — E785 Hyperlipidemia, unspecified: Secondary | ICD-10-CM

## 2010-08-07 ENCOUNTER — Encounter: Payer: Self-pay | Admitting: Family Medicine

## 2010-08-08 NOTE — Assessment & Plan Note (Signed)
Summary: review [facility] results///sph--missed appt this morni...   Vital Signs:  Patient profile:   50 year old male Weight:      250 pounds Pulse rate:   65 / minute Pulse rhythm:   regular BP sitting:   128 / 76  (right arm) Cuff size:   large  Vitals Entered By: Almeta Monas CMA Duncan Dull) (July 29, 2010 3:43 PM) CC: Review [facility]   History of Present Illness: Pt here to discuss [facility].  No complaints.   Current Medications (verified): 1)  Toprol Xl 100 Mg  Tb24 (Metoprolol Succinate) .Marland Kitchen.. 1 By Mouth Once Daily 2)  Adult Aspirin Low Strength 81 Mg  Tbdp (Aspirin) .Marland Kitchen.. 1 By Mouth Once Daily 3)  Flaxseed Oil 1000 Mg  Caps (Flaxseed (Linseed)) .... Take 2 Tablet By Mouth Two Times A Day 4)  Multivitamins  Caps (Multiple Vitamin) .... Take 1 Tablet By Mouth Once A Day 5)  Simcor 1000-40 Mg Xr24h-Tab (Niacin-Simvastatin) .Marland Kitchen.. 1 By Mouth At Bedtime 6)  Cpap .... [facility]  Allergies (verified): No Known Drug Allergies  Past History:  Past medical, surgical, family and social histories (including risk factors) reviewed for relevance to current acute and chronic problems.  Past Medical History: Reviewed history from 02/02/2007 and no changes required.  FAMILY HISTORY OF CAD MALE 1ST DEGREE RELATIVE <50 (ICD-V17.3) BASAL CELL CARCINOMA, NOSE (ICD-173.3) HERNIA, UMBILICAL (ICD-553.1) VENEREAL WART (ICD-078.11) HYPERTENSION (ICD-401.9) HYPERLIPIDEMIA (ICD-272.4)  Past Surgical History: Reviewed history from 02/02/2007 and no changes required. Appendectomy umbilical hernia Mohs surgery--nose wisdom teeth  Family History: Reviewed history from 02/02/2007 and no changes required. Family History of CAD Male 1st degree relative 2 F died at 56 of MI--- first one at 55 MGF-- died in 17s of mi Family History of Skin cancer  Social History: Reviewed history from 02/14/2008 and no changes required. 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 Alert and cooperative with normal attention span and concentration. No apparent delusions, illusions, hallucinations   Impression & Recommendations:  Problem # 1:  HYPERLIPIDEMIA (ICD-272.4)  His updated medication list for this problem includes:    Simcor 1000-40 Mg Xr24h-tab (Niacin-simvastatin) .Marland Kitchen... 1 by mouth at bedtime  Labs Reviewed: SGOT: 31 (05/25/2009)   SGPT: 33 (05/25/2009)  Prior 10 Yr Risk Heart Disease: Not enough information (02/02/2007)   HDL:35 (05/25/2009), 35.7 (02/14/2008)  LDL:49 (05/25/2009), 71 (02/14/2008)  Chol:110 (05/25/2009), 125 (02/14/2008)  Trig:129 (05/25/2009), 92 (02/14/2008)  Complete Medication List: 1)  Toprol Xl 100 Mg Tb24 (Metoprolol succinate) .Marland Kitchen.. 1 by mouth once daily 2)  Adult Aspirin Low Strength 81 Mg Tbdp (Aspirin) .Marland Kitchen.. 1 by mouth once daily 3)  Flaxseed Oil 1000 Mg Caps (Flaxseed (linseed)) .... Take 2 tablet by mouth two times a day 4)  Multivitamins Caps (Multiple vitamin) .... Take 1 tablet by mouth once a day 5)  Simcor 1000-40 Mg Xr24h-tab (Niacin-simvastatin) .Marland Kitchen.. 1 by mouth at bedtime 6)  Cpap  .... [facility] Prescriptions: SIMCOR 1000-40 MG XR24H-TAB (NIACIN-SIMVASTATIN) 1 by mouth at bedtime  #90 x 3   Entered and Authorized by:   Loreen Freud DO   Signed by:   Loreen Freud DO on 07/29/2010   Method used:   Faxed to .Marland KitchenMarland Kitchen  [facility] (mail-order)       [address]       [place], [place]  [address]       Ph: [phone]       Fax: [phone]   RxID:   [id number] SIMCOR 1000-40 MG XR24H-TAB (NIACIN-SIMVASTATIN) 1 by mouth at bedtime  #90 x 3   Entered and Authorized by:   Loreen Freud DO   Signed by:   Loreen Freud DO on  07/29/2010   Method used:   Print then Give to Patient   RxID:   [id number]    Orders Added: 1)  Est. Patient Level III [59563]

## 2011-02-28 ENCOUNTER — Encounter: Payer: Self-pay | Admitting: Family Medicine

## 2011-03-03 ENCOUNTER — Encounter: Payer: Self-pay | Admitting: Family Medicine

## 2011-03-03 ENCOUNTER — Ambulatory Visit (INDEPENDENT_AMBULATORY_CARE_PROVIDER_SITE_OTHER): Payer: Managed Care, Other (non HMO) | Admitting: Family Medicine

## 2011-03-03 VITALS — BP 126/82 | HR 53 | Temp 98.8°F | Ht 70.0 in | Wt 251.6 lb

## 2011-03-03 DIAGNOSIS — Z0000 Encounter for general adult medical examination without abnormal findings: Secondary | ICD-10-CM

## 2011-03-03 DIAGNOSIS — Z23 Encounter for immunization: Secondary | ICD-10-CM

## 2011-03-03 DIAGNOSIS — I10 Essential (primary) hypertension: Secondary | ICD-10-CM

## 2011-03-03 DIAGNOSIS — E785 Hyperlipidemia, unspecified: Secondary | ICD-10-CM

## 2011-03-03 LAB — MICROALBUMIN / CREATININE URINE RATIO
Creatinine,U: 156.2 mg/dL
Microalb Creat Ratio: 0.4 mg/g (ref 0.0–30.0)

## 2011-03-03 LAB — CBC WITH DIFFERENTIAL/PLATELET
Basophils Relative: 0.5 % (ref 0.0–3.0)
Eosinophils Relative: 2 % (ref 0.0–5.0)
HCT: 44.7 % (ref 39.0–52.0)
Hemoglobin: 15.6 g/dL (ref 13.0–17.0)
Lymphs Abs: 2 10*3/uL (ref 0.7–4.0)
MCV: 94.8 fl (ref 78.0–100.0)
Monocytes Relative: 9.5 % (ref 3.0–12.0)
Neutro Abs: 6 10*3/uL (ref 1.4–7.7)
WBC: 9.1 10*3/uL (ref 4.5–10.5)

## 2011-03-03 LAB — BASIC METABOLIC PANEL
Chloride: 106 mEq/L (ref 96–112)
GFR: 126.18 mL/min (ref >60.00)
Potassium: 4.4 mEq/L (ref 3.5–5.1)
Sodium: 142 mEq/L (ref 135–145)

## 2011-03-03 LAB — POCT URINALYSIS DIPSTICK
Blood, UA: NEGATIVE
Glucose, UA: NEGATIVE
Leukocytes, UA: NEGATIVE
Nitrite, UA: NEGATIVE
Urobilinogen, UA: 0.2

## 2011-03-03 LAB — LIPID PANEL
Cholesterol: 123 mg/dL (ref 0–200)
LDL Cholesterol: 50 mg/dL (ref 0–99)

## 2011-03-03 LAB — HEPATIC FUNCTION PANEL
ALT: 39 U/L (ref 0–53)
AST: 37 U/L (ref 0–37)
Albumin: 4.5 g/dL (ref 3.5–5.2)
Total Protein: 7 g/dL (ref 6.0–8.3)

## 2011-03-03 MED ORDER — METOPROLOL SUCCINATE ER 100 MG PO TB24: 100.0000 mg | ORAL_TABLET | Freq: Every day | ORAL | Status: DC

## 2011-03-03 MED ORDER — NIACIN-SIMVASTATIN ER 1000-40 MG PO TB24: ORAL_TABLET | ORAL | Status: DC

## 2011-03-03 NOTE — Progress Notes (Signed)
Subjective:    Patient ID: Raymond Howell, male    DOB: 09/21/1960, 50 y.o.   MRN: [id number]  HPI Pt here for cpe and labs.  No complaints.     Pt past medical, surgical, family and social histories were reviewed for relevance and changes. Past Medical History  Diagnosis Date  . Basal cell carcinoma of nose   . Umbilical hernia   . Venereal wart   . Hyperlipidemia   . Hypertension    History  Substance Use Topics  . Smoking status: Never Smoker   . Smokeless tobacco: Not on file  . Alcohol Use: Yes   Family History  Problem Relation Age of Onset  . Heart disease Father   . Coronary artery disease Other     1st degree relative  . Cancer Other     skin       Review of Systems    Review of Systems  Constitutional: Negative for activity change, appetite change and fatigue.  HENT: Negative for hearing loss, congestion, tinnitus and ear discharge.  dentist q80m Eyes: Negative for visual disturbance (see optho -due). Respiratory: Negative for cough, chest tightness and shortness of breath.   Cardiovascular: Negative for chest pain, palpitations and leg swelling.  Gastrointestinal: Negative for abdominal pain, diarrhea, constipation and abdominal distention.  Genitourinary: Negative for urgency, frequency, decreased urine volume and difficulty urinating.  Musculoskeletal: Negative for back pain, arthralgias and gait problem.  Skin: Negative for color change, pallor and rash.  Neurological: Negative for dizziness, light-headedness, numbness and headaches.  Hematological: Negative for adenopathy. Does not bruise/bleed easily.  Psychiatric/Behavioral: Negative for suicidal ideas, confusion, sleep disturbance, self-injury, dysphoric mood, decreased concentration and agitation.   Derm-- Dr Danella Deis   Objective:   Physical Exam BP 126/82  Pulse 53  Temp(Src) 98.8 F (37.1 C) (Oral)  Ht 5\' 10"  (1.778 m)  Wt 251 lb 9.6 oz (114.125 kg)  BMI 36.10 kg/m2  SpO2 97%  General  Appearance:    Alert, cooperative, no distress, appears stated age  Head:    Normocephalic, without obvious abnormality, atraumatic  Eyes:    PERRL, conjunctiva/corneas clear, EOM's intact, fundi    benign, both eyes       Ears:    Normal TM's and external ear canals, both ears  Nose:   Nares normal, septum midline, mucosa normal, no drainage   or sinus tenderness  Throat:   Lips, mucosa, and tongue normal; teeth and gums normal  Neck:   Supple, symmetrical, trachea midline, no adenopathy;       thyroid:  No enlargement/tenderness/nodules; no carotid   bruit or JVD  Back:     Symmetric, no curvature, ROM normal, no CVA tenderness  Lungs:     Clear to auscultation bilaterally, respirations unlabored  Chest wall:    No tenderness or deformity  Heart:    Regular rate and rhythm, S1 and S2 normal, no murmur, rub   or gallop  Abdomen:     Soft, non-tender, bowel sounds active all four quadrants,    no masses, no organomegaly  Genitalia:    Normal male without lesion, discharge or tenderness  Rectal:    Normal tone, normal prostate, no masses or tenderness;   guaiac negative stool  Extremities:   Extremities normal, atraumatic, no cyanosis or edema  Pulses:   2+ and symmetric all extremities  Skin:   Skin color, texture, turgor normal, no rashes or lesions  Lymph nodes:   Cervical, supraclavicular, and axillary  nodes normal  Neurologic:   CNII-XII intact. Normal strength, sensation and reflexes      throughout          Assessment & Plan:  1 cpe--ghm utd              Check fasting labs 2 htn --check labs,  con't meds, stable 3.  Hyperlipidemia--check labs 4. Sleep apnea--per pulm

## 2011-03-03 NOTE — Patient Instructions (Signed)
Preventative Care for Adults, Male A healthy lifestyle and preventative care can promote health and wellness. Preventative health guidelines for men include the following key practices:  A routine yearly physical is a good way to check with your caregiver about your health and preventative screening. It is a chance to share any concerns and updates on your health, and to receive a thorough exam.   Visit your dentist for a routine exam and preventative care every 6 months. Brush your teeth twice a day and floss once a day. Good oral hygiene prevents tooth decay and gum disease.   The frequency of eye exams is based on your age, health, family medical history, use of contact lenses, and other factors. Follow your caregiver's recommendations for frequency of eye exams.   Eat a healthy diet. Foods like vegetables, fruits, whole grains, low-fat dairy products, and lean protein foods contain the nutrients you need without too many calories. Decrease your intake of foods high in solid fats, added sugars, and salt. Eat the right amount of calories for you.Get information about a proper diet from your caregiver, if necessary.   Regular physical exercise is one of the most important things you can do for your health. Most adults should get at least 150 minutes of moderate-intensity exercise (any activity that increases your heart rate and causes you to sweat) each week. In addition, most adults need muscle-strengthening exercises on 2 or more days a week.   Maintain a healthy weight. The body mass index (BMI) is a screening tool to identify possible weight problems. It provides an estimate of body fat based on height and weight. Your caregiver can help determine your BMI, and can help you achieve or maintain a healthy weight.For adults 20 years and older:   A BMI below 18.5 is considered underweight.   A BMI of 18.5 to 24.9 is normal.   A BMI of 25 to 29.9 is considered overweight.   A BMI of 30 and  above is considered obese.   Maintain normal blood lipids and cholesterol levels by exercising and minimizing your intake of saturated fat. Eat a balanced diet with plenty of fruit and vegetables. Blood tests for lipids and cholesterol should begin at age 20 and be repeated every 5 years. If your lipid or cholesterol levels are high, you are over 50, or you are a high risk for heart disease, you may need your cholesterol levels checked more frequently.Ongoing high lipid and cholesterol levels should be treated with medicines if diet and exercise are not effective.   If you smoke, find out from your caregiver how to quit. If you do not use tobacco, do not start.   If you choose to drink alcohol, do not exceed 2 drinks per day. One drink is considered to be 12 ounces (355 mL) of beer, 5 ounces (148 mL) of wine, or 1.5 ounces (44 mL) of liquor.   Avoid use of street drugs. Do not share needles with anyone. Ask for help if you need support or instructions about stopping the use of drugs.   High blood pressure causes heart disease and increases the risk of stroke. Your blood pressure should be checked at least every 1 to 2 years. Ongoing high blood pressure should be treated with medicines, if weight loss and exercise are not effective.   If you are 45 to 51 years old, ask your caregiver if you should take aspirin to prevent heart disease.   Diabetes screening involves taking a blood   sample to check your fasting blood sugar level. This should be done once every 3 years, after age 45, if you are within normal weight and without risk factors for diabetes. Testing should be considered at a younger age or be carried out more frequently if you are overweight and have at least 1 risk factor for diabetes.   Colorectal cancer can be detected and often prevented. Most routine colorectal cancer screening begins at the age of 50 and continues through age 75. However, your caregiver may recommend screening at an  earlier age if you have risk factors for colon cancer. On a yearly basis, your caregiver may provide home test kits to check for hidden blood in the stool. Use of a small camera at the end of a tube, to directly examine the colon (sigmoidoscopy or colonoscopy), can detect the earliest forms of colorectal cancer. Talk to your caregiver about this at age 50, when routine screening begins. Direct examination of the colon should be repeated every 5 to 10 years through age 75, unless early forms of pre-cancerous polyps or small growths are found.   Practice safe sex. Use condoms and avoid high-risk sexual practices to reduce the spread of sexually transmitted infections (STIs). STIs include gonorrhea, chlamydia, syphilis, trichomonas, herpes, HPV, and human immunodeficiency virus (HIV). Herpes, HIV, and HPV are viral illnesses that have no cure. They can result in disability, cancer, and death.   A one-time screening for abdominal aortic aneurysm (AAA) and surgical repair of large AAAs by sound wave imaging (ultrasonography) is recommended for ages 65 to 75 years who are current or former smokers.   Healthy men should no longer receive prostate-specific antigen (PSA) blood tests as part of routine cancer screening. Consult with your caregiver about prostate cancer screening.   Use sunscreen with skin protection factor (SPF) of 30 or more. Apply sunscreen liberally and repeatedly throughout the day. You should seek shade when your shadow is shorter than you. Protect yourself by wearing long sleeves, pants, a wide-brimmed hat, and sunglasses year round, whenever you are outdoors.   Once a month, do a whole body skin exam, using a mirror to look at the skin on your back. Notify your caregiver of new moles, moles that have irregular borders, moles that are larger than a pencil eraser, or moles that have changed in shape or color.   Stay current with required immunizations.   Influenza. You need a dose every  fall (or winter). The composition of the flu vaccine changes each year, so being vaccinated once is not enough.   Pneumococcal polysaccharide. You need 1 to 2 doses if you smoke cigarettes or if you have certain chronic medical conditions. You need 1 dose at age 65 (or older) if you have never been vaccinated.   Tetanus, diphtheria, pertussis (Tdap, Td). Get 1 dose of Tdap vaccine if you are younger than age 65 years, are over 65 and have contact with an infant, are a healthcare worker, or simply want to be protected from whooping cough. After that, you need a Td booster dose every 10 years. Consult your caregiver if you have not had at least 3 tetanus and diphtheria-containing shots sometime in your life or have a deep or dirty wound.   HPV. This vaccine is recommended for males 13 through 51 years of age. This vaccine may be given to men 22 through 51 years of age who have not completed the 3 dose series. It is recommended for men through age 26   who have sex with men or whose immune system is weakened because of HIV infection, other illness, or medications. The vaccine is given in 3 doses over 6 months.   Measles, mumps, rubella (MMR). You need at least 1 dose of MMR if you were born in 1957 or later. You may also need a 2nd dose.   Meningococcal. If you are age 19 to 21 years and a first-year college student living in a residence hall, or have one of several medical conditions, you need to get vaccinated against meningococcal disease. You may also need additional booster doses.   Zoster (shingles). If you are age 60 years or older, you should get this vaccine.   Varicella (chickenpox). If you have never had chickenpox or you were vaccinated but received only 1 dose, talk to your caregiver to find out if you need this vaccine.   Hepatitis A. You need this vaccine if you have a specific risk factor for hepatitis A virus infection, or you simply wish to be protected from this disease. The vaccine is  usually given as 2 doses, 6 to 18 months apart.   Hepatitis B. You need this vaccine if you have a specific risk factor for hepatitis B virus infection or you simply wish to be protected from this disease. The vaccine is given in 3 doses, usually over 6 months.  Preventative Service / Frequency Ages 19 to 39  Blood pressure check.** / Every 1 to 2 years.   Lipid and cholesterol check.**/ Every 5 years beginning at age 20.   Skin self-exam. / Monthly.   Influenza immunization.** / Every year.   Pneumococcal polysaccharide immunization.** / 1 to 2 doses if you smoke cigarettes or if you have certain chronic medical conditions.   Tetanus, diphtheria, pertussis (Tdap,Td) immunization. / A one-time dose of Tdap vaccine. After that, you need a Td booster dose every 10 years.   HPV immunization. / 3 doses over 6 months, if 26 and younger.   Measles, mumps, rubella (MMR) immunization. / You need at least 1 dose of MMR if you were born in 1957 or later. You may also need a 2nd dose.   Meningococcal immunization. / 1 dose if you are age 19 to 21 years and a first-year college student living in a residence hall, or have one of several medical conditions, you need to get vaccinated against meningococcal disease. You may also need additional booster doses.   Varicella immunization. **/ Consult your caregiver.   Hepatitis A immunization. ** / Consult your caregiver. 2 doses, 6 to 18 months apart.   Hepatitis B immunization.** / Consult your caregiver. 3 doses usually over 6 months.  Ages 40 to 64  Blood pressure check.** / Every 1 to 2 years.   Lipid and cholesterol check.**/ Every 5 years beginning at age 20.   Fecal occult blood test (FOBT) of stool. / Every year beginning at age 50 and continuing until age 75. You may not have to do this test if you get colonoscopy every 10 years.   Flexible sigmoidoscopy** or colonoscopy.** / Every 5 years for a flexible sigmoidoscopy or every 10 years for  a colonoscopy beginning at age 50 and continuing until age 75.   Skin self-exam. / Monthly.   Influenza immunization.** / Every year.   Pneumococcal polysaccharide immunization.** / 1 to 2 doses if you smoke cigarettes or if you have certain chronic medical conditions.   Tetanus, diphtheria, pertussis (Tdap/Td) immunization.** / A one-time dose of   Tdap vaccine. After that, you need a Td booster dose every 10 years.   Measles, mumps, rubella (MMR) immunization. / You need at least 1 dose of MMR if you were born in 1957 or later. You may also need a 2nd dose.   Varicella immunization. **/ Consult your caregiver.   Meningococcal immunization.** / Consult your caregiver.   Hepatitis A immunization. ** / Consult your caregiver. 2 doses, 6 to 18 months apart.   Hepatitis B immunization.** / Consult your caregiver. 3 doses, usually over 6 months.  Ages 65 and over  Blood pressure check.** / Every 1 to 2 years.   Lipid and cholesterol check.**/ Every 5 years beginning at age 20.   Fecal occult blood test (FOBT) of stool. / Every year beginning at age 50 and continuing until age 75. You may not have to do this test if you get colonoscopy every 10 years.   Flexible sigmoidoscopy** or colonoscopy.** / Every 5 years for a flexible sigmoidoscopy or every 10 years for a colonoscopy beginning at age 50 and continuing until age 75.   Abdominal aortic aneurysm (AAA) screening.** / A one-time screening for ages 65 to 75 years who are current or former smokers.   Skin self-exam. / Monthly.   Influenza immunization.** / Every year.   Pneumococcal polysaccharide immunization.** / 1 dose at age 65 (or older) if you have never been vaccinated.   Tetanus, diphtheria, pertussis (Tdap, Td) immunization. / A one-time dose of Tdap vaccine if you are over 65 and have contact with an infant, are a healthcare worker, or simply want to be protected from whooping cough. After that, you need a Td booster dose  every 10 years.   Varicella immunization. **/ Consult your caregiver.   Meningococcal immunization.** / Consult your caregiver.   Hepatitis A immunization. ** / Consult your caregiver. 2 doses, 6 to 18 months apart.   Hepatitis B immunization.** / Check with your caregiver. 3 doses, usually over 6 months.  **Family history and personal history of risk and conditions may change your caregiver's recommendations. Document Released: 06/24/2001 Document Revised: 01/08/2011 Document Reviewed: 09/23/2010 [employer] Patient Information 2012 [employer]. 

## 2011-03-25 ENCOUNTER — Ambulatory Visit: Payer: Managed Care, Other (non HMO) | Admitting: Pulmonary Disease

## 2011-04-01 ENCOUNTER — Ambulatory Visit (INDEPENDENT_AMBULATORY_CARE_PROVIDER_SITE_OTHER): Payer: Managed Care, Other (non HMO) | Admitting: Pulmonary Disease

## 2011-04-01 ENCOUNTER — Encounter: Payer: Self-pay | Admitting: Pulmonary Disease

## 2011-04-01 DIAGNOSIS — G4730 Sleep apnea, unspecified: Secondary | ICD-10-CM

## 2011-04-01 NOTE — Assessment & Plan Note (Signed)
Mod OSA maintained on CPAP 9 cm. Good compliance by report Weight loss encouraged, compliance with goal of at least 6 hrs every night is the expectation. Advised against medications with sedative side effects Cautioned against driving when sleepy - understanding that sleepiness will vary on a day to day basis Discussed care of machine, supplies etc

## 2011-04-01 NOTE — Patient Instructions (Signed)
Call for increased snoring or fatigue

## 2011-04-01 NOTE — Progress Notes (Signed)
  Subjective:    Patient ID: Raymond Howell, male    DOB: [birth year], 50 y.o.   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  He had a sleep evaluation 2004 (205 lbs )- AHI was 12/ , did not need treatment. He has gained 35 lbs since then & his wife has noted loud snoring & witnessed apneas.  12/11 PSG (240 lbs ) >> AHI 22/h , lowest desatn 87%, PLMs disappeared with PAP. Central apneas emerged at 11 cm & persisted on BiPAP 13/9.  Tolerated CPAP 9 cm well, full face mask -  04/01/2011  pressure ok, mask ok, no dryness,Uses humidity only in winter. Good compliance per report. Able to drive long distances without sleepiness. Fatigue still present but no sleepiness   There is no history suggestive of cataplexy, sleep paralysis or parasomnias     Review of Systems Patient denies significant dyspnea,cough, hemoptysis,  chest pain, palpitations, pedal edema, orthopnea, paroxysmal nocturnal dyspnea, lightheadedness, nausea, vomiting, abdominal or  leg pains      Objective:   Physical Exam Gen. Pleasant, well-nourished, in no distress ENT - no lesions, no post nasal drip Neck: No JVD, no thyromegaly, no carotid bruits Lungs: no use of accessory muscles, no dullness to percussion, clear without rales or rhonchi  Cardiovascular: Rhythm regular, heart sounds  normal, no murmurs or gallops, no peripheral edema Musculoskeletal: No deformities, no cyanosis or clubbing         Assessment & Plan:

## 2011-04-04 ENCOUNTER — Telehealth: Payer: Self-pay | Admitting: *Deleted

## 2011-04-04 NOTE — Telephone Encounter (Signed)
Pt called back.  Informed him of the information.  Pt states he has another chip at home and wonders if he got this chip mixed up with the one he dropped off at the office.  Pt wonders if the chip he has at home has current data on it.  He states he is going to bring it by on Monday or Tuesday of next week for Jess to download.

## 2011-04-04 NOTE — Telephone Encounter (Signed)
lmomtcb x 1. Pt's [employer] chip did not have any data on it. Sharyn Creamer and asked for download. Left message with Marcelino Duster who advised she will get the message to the appropriate party and call back.

## 2011-04-13 ENCOUNTER — Telehealth: Payer: Self-pay | Admitting: Pulmonary Disease

## 2011-04-13 NOTE — Telephone Encounter (Signed)
Reviewed download 04/19/10 - 01/07/11 >> few residual hypopneas on 10 cm. Note that he had centrals during titration on 11 cm Given that he is symptomatically better , will not increase pressure. He can call if sleepiness recurs. Good compliance

## 2011-04-14 NOTE — Telephone Encounter (Signed)
I informed pt of RA's findings and recommendations. Pt verbalized understanding. Pt states he forgot to mention to Dr. Vassie Loll he had d/c'd caffeine and is sleeping better and feeling more rested during the day.

## 2011-04-14 NOTE — Telephone Encounter (Signed)
Received download on Friday and placed in RA's "look at".

## 2011-04-21 ENCOUNTER — Encounter: Payer: Self-pay | Admitting: Pulmonary Disease

## 2011-09-15 ENCOUNTER — Other Ambulatory Visit: Payer: Managed Care, Other (non HMO)

## 2011-09-17 ENCOUNTER — Other Ambulatory Visit: Payer: Self-pay | Admitting: Family Medicine

## 2011-09-17 DIAGNOSIS — R7989 Other specified abnormal findings of blood chemistry: Secondary | ICD-10-CM

## 2011-09-17 DIAGNOSIS — E785 Hyperlipidemia, unspecified: Secondary | ICD-10-CM

## 2011-09-18 ENCOUNTER — Other Ambulatory Visit (INDEPENDENT_AMBULATORY_CARE_PROVIDER_SITE_OTHER): Payer: Managed Care, Other (non HMO)

## 2011-09-18 DIAGNOSIS — R7989 Other specified abnormal findings of blood chemistry: Secondary | ICD-10-CM

## 2011-09-18 DIAGNOSIS — E785 Hyperlipidemia, unspecified: Secondary | ICD-10-CM

## 2011-09-18 LAB — HEPATIC FUNCTION PANEL: Albumin: 4.4 g/dL (ref 3.5–5.2)

## 2011-09-18 LAB — LIPID PANEL
HDL: 38.6 mg/dL — ABNORMAL LOW (ref >39.00)
LDL Cholesterol: 47 mg/dL (ref 0–99)
Total CHOL/HDL Ratio: 3
Triglycerides: 85 mg/dL (ref 0.0–149.0)
VLDL: 17 mg/dL (ref 0.0–40.0)

## 2011-09-18 LAB — BASIC METABOLIC PANEL
CO2: 24 mEq/L (ref 19–32)
Calcium: 9.2 mg/dL (ref 8.4–10.5)
Chloride: 111 mEq/L (ref 96–112)
Potassium: 4.3 mEq/L (ref 3.5–5.1)
Sodium: 145 mEq/L (ref 135–145)

## 2011-10-10 ENCOUNTER — Encounter: Payer: Self-pay | Admitting: Family Medicine

## 2011-10-10 ENCOUNTER — Ambulatory Visit (INDEPENDENT_AMBULATORY_CARE_PROVIDER_SITE_OTHER): Payer: Managed Care, Other (non HMO) | Admitting: Family Medicine

## 2011-10-10 VITALS — BP 128/82 | HR 75 | Temp 98.7°F | Ht 69.75 in | Wt 248.8 lb

## 2011-10-10 DIAGNOSIS — J019 Acute sinusitis, unspecified: Secondary | ICD-10-CM | POA: Insufficient documentation

## 2011-10-10 MED ORDER — AMOXICILLIN-POT CLAVULANATE 875-125 MG PO TABS: 1.0000 | ORAL_TABLET | Freq: Two times a day (BID) | ORAL | Status: AC

## 2011-10-10 MED ORDER — HYDROCODONE-ACETAMINOPHEN 5-500 MG PO TABS: 1.0000 | ORAL_TABLET | Freq: Three times a day (TID) | ORAL | Status: AC | PRN

## 2011-10-10 NOTE — Patient Instructions (Signed)
Start the Augmentin twice daily w/ food (this will cover sinus infection and/or tooth abscess) If no improvement w/ the antibiotics, it's a root issue and you need to see the dentist Use the Vicodin for pain relief- you can combine this w/ ibuprofen 800mg  3x/day Call with any questions or concerns Hang in there! Happy early birthday!!!

## 2011-10-10 NOTE — Progress Notes (Signed)
  Subjective:    Patient ID: Raymond Howell, male    DOB: Feb 01, 1961, 51 y.o.   MRN: [id number]  HPI Facial pain/swelling- had problems w/ L upper back tooth in April and dentist tried to do a tooth sparing procedure.  Was told this might not work and would need revision.  Has had mild pain for the last month but over the weekend had severe pain on L side of face, around eye, jaw.  Last night started 875mg  of Amox, took 2nd dose this AM.  Also taking 800mg  Ibuprofen for pain.  No fevers.  This AM had some yellow nasal drainage.  No cough.  No known sick contacts.   Review of Systems For ROS see HPI     Objective:   Physical Exam  Vitals reviewed. Constitutional: He appears well-developed and well-nourished. No distress.  HENT:  Head: Normocephalic and atraumatic.  Right Ear: Tympanic membrane normal.  Left Ear: Tympanic membrane normal.  Nose: Mucosal edema and rhinorrhea present. Right sinus exhibits no maxillary sinus tenderness and no frontal sinus tenderness. Left sinus exhibits maxillary sinus tenderness and frontal sinus tenderness.  Mouth/Throat: Mucous membranes are normal. Oropharyngeal exudate and posterior oropharyngeal erythema present. No posterior oropharyngeal edema.       + PND  Eyes: Conjunctivae and EOM are normal. Pupils are equal, round, and reactive to light.  Neck: Normal range of motion. Neck supple.  Cardiovascular: Normal rate, regular rhythm and normal heart sounds.   Pulmonary/Chest: Effort normal and breath sounds normal. No respiratory distress. He has no wheezes.  Lymphadenopathy:    He has no cervical adenopathy.  Skin: Skin is warm and dry.          Assessment & Plan:

## 2011-10-12 NOTE — Assessment & Plan Note (Signed)
New.  Pt's sxs and PE consistent w/ sinus infxn and not abscessed tooth.  Will switch from Amox to Augmentin for broad spectrum coverage in case of abscess.  Reviewed supportive care and red flags that should prompt return.  Pt expressed understanding and is in agreement w/ plan.

## 2012-03-05 ENCOUNTER — Encounter: Payer: Self-pay | Admitting: Family Medicine

## 2012-03-05 ENCOUNTER — Ambulatory Visit (INDEPENDENT_AMBULATORY_CARE_PROVIDER_SITE_OTHER): Payer: Managed Care, Other (non HMO) | Admitting: Family Medicine

## 2012-03-05 VITALS — BP 118/82 | HR 54 | Temp 98.4°F | Ht 70.0 in | Wt 244.8 lb

## 2012-03-05 DIAGNOSIS — I10 Essential (primary) hypertension: Secondary | ICD-10-CM

## 2012-03-05 DIAGNOSIS — C44300 Unspecified malignant neoplasm of skin of unspecified part of face: Secondary | ICD-10-CM

## 2012-03-05 DIAGNOSIS — G4730 Sleep apnea, unspecified: Secondary | ICD-10-CM

## 2012-03-05 DIAGNOSIS — E785 Hyperlipidemia, unspecified: Secondary | ICD-10-CM

## 2012-03-05 DIAGNOSIS — Z0000 Encounter for general adult medical examination without abnormal findings: Secondary | ICD-10-CM

## 2012-03-05 DIAGNOSIS — Z23 Encounter for immunization: Secondary | ICD-10-CM

## 2012-03-05 DIAGNOSIS — Z8249 Family history of ischemic heart disease and other diseases of the circulatory system: Secondary | ICD-10-CM

## 2012-03-05 LAB — HEPATIC FUNCTION PANEL
ALT: 40 U/L (ref 0–53)
AST: 34 U/L (ref 0–37)
Albumin: 4.3 g/dL (ref 3.5–5.2)

## 2012-03-05 LAB — CBC WITH DIFFERENTIAL/PLATELET
Basophils Absolute: 0 10*3/uL (ref 0.0–0.1)
Eosinophils Relative: 1.6 % (ref 0.0–5.0)
HCT: 47 % (ref 39.0–52.0)
Hemoglobin: 15.9 g/dL (ref 13.0–17.0)
Lymphs Abs: 1.8 10*3/uL (ref 0.7–4.0)
Monocytes Relative: 8.2 % (ref 3.0–12.0)
Neutro Abs: 5.7 10*3/uL (ref 1.4–7.7)
RDW: 12.3 % (ref 11.5–14.6)

## 2012-03-05 LAB — TSH: TSH: 1.33 u[IU]/mL (ref 0.35–5.50)

## 2012-03-05 LAB — BASIC METABOLIC PANEL
Chloride: 105 mEq/L (ref 96–112)
GFR: 100.46 mL/min (ref >60.00)
Glucose, Bld: 105 mg/dL — ABNORMAL HIGH (ref 70–99)
Potassium: 4.2 mEq/L (ref 3.5–5.1)
Sodium: 140 mEq/L (ref 135–145)

## 2012-03-05 LAB — LIPID PANEL: Cholesterol: 114 mg/dL (ref 0–200)

## 2012-03-05 MED ORDER — NIACIN-SIMVASTATIN ER 1000-40 MG PO TB24: ORAL_TABLET | ORAL | Status: DC

## 2012-03-05 MED ORDER — METOPROLOL SUCCINATE ER 100 MG PO TB24: 100.0000 mg | ORAL_TABLET | Freq: Every day | ORAL | Status: DC

## 2012-03-05 NOTE — Progress Notes (Signed)
Subjective:    Patient ID: Raymond Howell, male    DOB: 03-06-1961, 51 y.o.   MRN: [id number]  HPI Pt here for cpe and labs.  No concerns except his father had an MI at 56 and pt is 15 and is concerned about his heart.   No other complaints.   Review of Systems Review of Systems  Constitutional: Negative for activity change, appetite change and fatigue.  HENT: Negative for hearing loss, congestion, tinnitus and ear discharge.  dentist q50m Eyes: Negative for visual disturbance (see optho--due) Respiratory: Negative for cough, chest tightness and shortness of breath.   Cardiovascular: Negative for chest pain, palpitations and leg swelling.  Gastrointestinal: Negative for abdominal pain, diarrhea, constipation and abdominal distention.  Genitourinary: Negative for urgency, frequency, decreased urine volume and difficulty urinating.  Musculoskeletal: Negative for back pain, arthralgias and gait problem.  Skin: Negative for color change, pallor and rash. ---sees derm annually Neurological: Negative for dizziness, light-headedness, numbness and headaches.  Hematological: Negative for adenopathy. Does not bruise/bleed easily.  Psychiatric/Behavioral: Negative for suicidal ideas, confusion, sleep disturbance, self-injury, dysphoric mood, decreased concentration and agitation.   Past Medical History  Diagnosis Date  . Basal cell carcinoma of nose   . Umbilical hernia   . Venereal wart   . Hyperlipidemia   . Hypertension    History  Substance Use Topics  . Smoking status: Never Smoker   . Smokeless tobacco: Never Used  . Alcohol Use: Yes   Family History  Problem Relation Age of Onset  . Heart disease Father 75    MI  . Coronary artery disease Other     1st degree relative  . Cancer Other     skin   Current Outpatient Prescriptions on File Prior to Visit  Medication Sig Dispense Refill  . aspirin 81 MG tablet Take 81 mg by mouth daily.        . Flaxseed, Linseed, (FLAXSEED OIL)  1000 MG CAPS Take by mouth 2 (two) times daily.        . Multiple Vitamin (MULTIVITAMIN) tablet Take 1 tablet by mouth daily.        Marland Kitchen DISCONTD: metoprolol (TOPROL-XL) 100 MG 24 hr tablet Take 1 tablet (100 mg total) by mouth daily.  90 tablet  3  . DISCONTD: Niacin-Simvastatin (SIMCOR) 1000-40 MG TB24 1 po qhs  90 tablet  3         Objective:   Physical Exam BP 118/82  Pulse 54  Temp 98.4 F (36.9 C) (Oral)  Ht 5\' 10"  (1.778 m)  Wt 244 lb 12.8 oz (111.041 kg)  BMI 35.13 kg/m2  SpO2 96%  General Appearance:    Alert, cooperative, no distress, appears stated age  Head:    Normocephalic, without obvious abnormality, atraumatic  Eyes:    PERRL, conjunctiva/corneas clear, EOM's intact, fundi    benign, both eyes       Ears:    Normal TM's and external ear canals, both ears  Nose:   Nares normal, septum midline, mucosa normal, no drainage   or sinus tenderness  Throat:   Lips, mucosa, and tongue normal; teeth and gums normal  Neck:   Supple, symmetrical, trachea midline, no adenopathy;       thyroid:  No enlargement/tenderness/nodules; no carotid   bruit or JVD  Back:     Symmetric, no curvature, ROM normal, no CVA tenderness  Lungs:     Clear to auscultation bilaterally, respirations unlabored  Chest wall:  No tenderness or deformity  Heart:    Regular rate and rhythm, S1 and S2 normal, no murmur, rub   or gallop  Abdomen:     Soft, non-tender, bowel sounds active all four quadrants,    no masses, no organomegaly  Genitalia:    Normal male without lesion, discharge or tenderness  Rectal:    Normal tone, normal prostate, no masses or tenderness;   guaiac negative stool  Extremities:   Extremities normal, atraumatic, no cyanosis or edema  Pulses:   2+ and symmetric all extremities  Skin:   Skin color, texture, turgor normal, no rashes or lesions  Lymph nodes:   Cervical, supraclavicular, and axillary nodes normal  Neurologic:   CNII-XII intact. Normal strength, sensation and  reflexes      throughout         Assessment & Plan:  cpe-- check labs             ghm utd

## 2012-03-05 NOTE — Patient Instructions (Addendum)
Preventive Care for Adults, Male A healthy lifestyle and preventive care can promote health and wellness. Preventive health guidelines for men include the following key practices:  A routine yearly physical is a good way to check with your caregiver about your health and preventative screening. It is a chance to share any concerns and updates on your health, and to receive a thorough exam.  Visit your dentist for a routine exam and preventative care every 6 months. Brush your teeth twice a day and floss once a day. Good oral hygiene prevents tooth decay and gum disease.  The frequency of eye exams is based on your age, health, family medical history, use of contact lenses, and other factors. Follow your caregiver's recommendations for frequency of eye exams.  Eat a healthy diet. Foods like vegetables, fruits, whole grains, low-fat dairy products, and lean protein foods contain the nutrients you need without too many calories. Decrease your intake of foods high in solid fats, added sugars, and salt. Eat the right amount of calories for you.Get information about a proper diet from your caregiver, if necessary.  Regular physical exercise is one of the most important things you can do for your health. Most adults should get at least 150 minutes of moderate-intensity exercise (any activity that increases your heart rate and causes you to sweat) each week. In addition, most adults need muscle-strengthening exercises on 2 or more days a week.  Maintain a healthy weight. The body mass index (BMI) is a screening tool to identify possible weight problems. It provides an estimate of body fat based on height and weight. Your caregiver can help determine your BMI, and can help you achieve or maintain a healthy weight.For adults 20 years and older:  A BMI below 18.5 is considered underweight.  A BMI of 18.5 to 24.9 is normal.  A BMI of 25 to 29.9 is considered overweight.  A BMI of 30 and above is  considered obese.  Maintain normal blood lipids and cholesterol levels by exercising and minimizing your intake of saturated fat. Eat a balanced diet with plenty of fruit and vegetables. Blood tests for lipids and cholesterol should begin at age 20 and be repeated every 5 years. If your lipid or cholesterol levels are high, you are over 50, or you are a high risk for heart disease, you may need your cholesterol levels checked more frequently.Ongoing high lipid and cholesterol levels should be treated with medicines if diet and exercise are not effective.  If you smoke, find out from your caregiver how to quit. If you do not use tobacco, do not start.  If you choose to drink alcohol, do not exceed 2 drinks per day. One drink is considered to be 12 ounces (355 mL) of beer, 5 ounces (148 mL) of wine, or 1.5 ounces (44 mL) of liquor.  Avoid use of street drugs. Do not share needles with anyone. Ask for help if you need support or instructions about stopping the use of drugs.  High blood pressure causes heart disease and increases the risk of stroke. Your blood pressure should be checked at least every 1 to 2 years. Ongoing high blood pressure should be treated with medicines, if weight loss and exercise are not effective.  If you are 45 to 52 years old, ask your caregiver if you should take aspirin to prevent heart disease.  Diabetes screening involves taking a blood sample to check your fasting blood sugar level. This should be done once every 3 years,   after age 45, if you are within normal weight and without risk factors for diabetes. Testing should be considered at a younger age or be carried out more frequently if you are overweight and have at least 1 risk factor for diabetes.  Colorectal cancer can be detected and often prevented. Most routine colorectal cancer screening begins at the age of 50 and continues through age 75. However, your caregiver may recommend screening at an earlier age if you  have risk factors for colon cancer. On a yearly basis, your caregiver may provide home test kits to check for hidden blood in the stool. Use of a small camera at the end of a tube, to directly examine the colon (sigmoidoscopy or colonoscopy), can detect the earliest forms of colorectal cancer. Talk to your caregiver about this at age 50, when routine screening begins. Direct examination of the colon should be repeated every 5 to 10 years through age 75, unless early forms of pre-cancerous polyps or small growths are found.  Hepatitis C blood testing is recommended for all people born from 1945 through 1965 and any individual with known risks for hepatitis C.  Practice safe sex. Use condoms and avoid high-risk sexual practices to reduce the spread of sexually transmitted infections (STIs). STIs include gonorrhea, chlamydia, syphilis, trichomonas, herpes, HPV, and human immunodeficiency virus (HIV). Herpes, HIV, and HPV are viral illnesses that have no cure. They can result in disability, cancer, and death.  A one-time screening for abdominal aortic aneurysm (AAA) and surgical repair of large AAAs by sound wave imaging (ultrasonography) is recommended for ages 65 to 75 years who are current or former smokers.  Healthy men should no longer receive prostate-specific antigen (PSA) blood tests as part of routine cancer screening. Consult with your caregiver about prostate cancer screening.  Testicular cancer screening is not recommended for adult males who have no symptoms. Screening includes self-exam, caregiver exam, and other screening tests. Consult with your caregiver about any symptoms you have or any concerns you have about testicular cancer.  Use sunscreen with skin protection factor (SPF) of 30 or more. Apply sunscreen liberally and repeatedly throughout the day. You should seek shade when your shadow is shorter than you. Protect yourself by wearing long sleeves, pants, a wide-brimmed hat, and  sunglasses year round, whenever you are outdoors.  Once a month, do a whole body skin exam, using a mirror to look at the skin on your back. Notify your caregiver of new moles, moles that have irregular borders, moles that are larger than a pencil eraser, or moles that have changed in shape or color.  Stay current with required immunizations.  Influenza. You need a dose every fall (or winter). The composition of the flu vaccine changes each year, so being vaccinated once is not enough.  Pneumococcal polysaccharide. You need 1 to 2 doses if you smoke cigarettes or if you have certain chronic medical conditions. You need 1 dose at age 65 (or older) if you have never been vaccinated.  Tetanus, diphtheria, pertussis (Tdap, Td). Get 1 dose of Tdap vaccine if you are younger than age 65 years, are over 65 and have contact with an infant, are a healthcare worker, or simply want to be protected from whooping cough. After that, you need a Td booster dose every 10 years. Consult your caregiver if you have not had at least 3 tetanus and diphtheria-containing shots sometime in your life or have a deep or dirty wound.  HPV. This vaccine is recommended   for males 13 through 52 years of age. This vaccine may be given to men 22 through 52 years of age who have not completed the 3 dose series. It is recommended for men through age 26 who have sex with men or whose immune system is weakened because of HIV infection, other illness, or medications. The vaccine is given in 3 doses over 6 months.  Measles, mumps, rubella (MMR). You need at least 1 dose of MMR if you were born in 1957 or later. You may also need a 2nd dose.  Meningococcal. If you are age 19 to 21 years and a first-year college student living in a residence hall, or have one of several medical conditions, you need to get vaccinated against meningococcal disease. You may also need additional booster doses.  Zoster (shingles). If you are age 60 years or  older, you should get this vaccine.  Varicella (chickenpox). If you have never had chickenpox or you were vaccinated but received only 1 dose, talk to your caregiver to find out if you need this vaccine.  Hepatitis A. You need this vaccine if you have a specific risk factor for hepatitis A virus infection, or you simply wish to be protected from this disease. The vaccine is usually given as 2 doses, 6 to 18 months apart.  Hepatitis B. You need this vaccine if you have a specific risk factor for hepatitis B virus infection or you simply wish to be protected from this disease. The vaccine is given in 3 doses, usually over 6 months. Preventative Service / Frequency Ages 19 to 39  Blood pressure check.** / Every 1 to 2 years.  Lipid and cholesterol check.** / Every 5 years beginning at age 20.  Hepatitis C blood test.** / For any individual with known risks for hepatitis C.  Skin self-exam. / Monthly.  Influenza immunization.** / Every year.  Pneumococcal polysaccharide immunization.** / 1 to 2 doses if you smoke cigarettes or if you have certain chronic medical conditions.  Tetanus, diphtheria, pertussis (Tdap,Td) immunization. / A one-time dose of Tdap vaccine. After that, you need a Td booster dose every 10 years.  HPV immunization. / 3 doses over 6 months, if 26 and younger.  Measles, mumps, rubella (MMR) immunization. / You need at least 1 dose of MMR if you were born in 1957 or later. You may also need a 2nd dose.  Meningococcal immunization. / 1 dose if you are age 19 to 21 years and a first-year college student living in a residence hall, or have one of several medical conditions, you need to get vaccinated against meningococcal disease. You may also need additional booster doses.  Varicella immunization.** / Consult your caregiver.  Hepatitis A immunization.** / Consult your caregiver. 2 doses, 6 to 18 months apart.  Hepatitis B immunization.** / Consult your caregiver. 3 doses  usually over 6 months. Ages 40 to 64  Blood pressure check.** / Every 1 to 2 years.  Lipid and cholesterol check.** / Every 5 years beginning at age 20.  Fecal occult blood test (FOBT) of stool. / Every year beginning at age 50 and continuing until age 75. You may not have to do this test if you get colonoscopy every 10 years.  Flexible sigmoidoscopy** or colonoscopy.** / Every 5 years for a flexible sigmoidoscopy or every 10 years for a colonoscopy beginning at age 50 and continuing until age 75.  Hepatitis C blood test.** / For all people born from 1945 through 1965 and any   individual with known risks for hepatitis C.  Skin self-exam. / Monthly.  Influenza immunization.** / Every year.  Pneumococcal polysaccharide immunization.** / 1 to 2 doses if you smoke cigarettes or if you have certain chronic medical conditions.  Tetanus, diphtheria, pertussis (Tdap/Td) immunization.** / A one-time dose of Tdap vaccine. After that, you need a Td booster dose every 10 years.  Measles, mumps, rubella (MMR) immunization. / You need at least 1 dose of MMR if you were born in 1957 or later. You may also need a 2nd dose.  Varicella immunization.**/ Consult your caregiver.  Meningococcal immunization.** / Consult your caregiver.  Hepatitis A immunization.** / Consult your caregiver. 2 doses, 6 to 18 months apart.  Hepatitis B immunization.** / Consult your caregiver. 3 doses, usually over 6 months. Ages 65 and over  Blood pressure check.** / Every 1 to 2 years.  Lipid and cholesterol check.**/ Every 5 years beginning at age 20.  Fecal occult blood test (FOBT) of stool. / Every year beginning at age 50 and continuing until age 75. You may not have to do this test if you get colonoscopy every 10 years.  Flexible sigmoidoscopy** or colonoscopy.** / Every 5 years for a flexible sigmoidoscopy or every 10 years for a colonoscopy beginning at age 50 and continuing until age 75.  Hepatitis C blood  test.** / For all people born from 1945 through 1965 and any individual with known risks for hepatitis C.  Abdominal aortic aneurysm (AAA) screening.** / A one-time screening for ages 65 to 75 years who are current or former smokers.  Skin self-exam. / Monthly.  Influenza immunization.** / Every year.  Pneumococcal polysaccharide immunization.** / 1 dose at age 65 (or older) if you have never been vaccinated.  Tetanus, diphtheria, pertussis (Tdap, Td) immunization. / A one-time dose of Tdap vaccine if you are over 65 and have contact with an infant, are a healthcare worker, or simply want to be protected from whooping cough. After that, you need a Td booster dose every 10 years.  Varicella immunization. ** / Consult your caregiver.  Meningococcal immunization.** / Consult your caregiver.  Hepatitis A immunization. ** / Consult your caregiver. 2 doses, 6 to 18 months apart.  Hepatitis B immunization.** / Check with your caregiver. 3 doses, usually over 6 months. **Family history and personal history of risk and conditions may change your caregiver's recommendations. Document Released: 06/24/2001 Document Revised: 07/21/2011 Document Reviewed: 09/23/2010 Exit[employer] Patient Information 2013 [employer].  

## 2012-03-05 NOTE — Assessment & Plan Note (Signed)
Check labs Refill meds  

## 2012-03-05 NOTE — Assessment & Plan Note (Signed)
con't meds stable 

## 2012-03-05 NOTE — Assessment & Plan Note (Signed)
Per pulmonary -- on cpap

## 2012-03-05 NOTE — Assessment & Plan Note (Signed)
Sees derm regularly.

## 2012-03-07 ENCOUNTER — Encounter: Payer: Self-pay | Admitting: Family Medicine

## 2012-03-09 LAB — POCT URINALYSIS DIPSTICK
Bilirubin, UA: NEGATIVE
Glucose, UA: NEGATIVE
Ketones, UA: NEGATIVE
Spec Grav, UA: >=1.03

## 2012-03-30 ENCOUNTER — Ambulatory Visit: Payer: Managed Care, Other (non HMO) | Admitting: Pulmonary Disease

## 2012-03-31 ENCOUNTER — Ambulatory Visit: Payer: Managed Care, Other (non HMO) | Admitting: Pulmonary Disease

## 2012-04-06 ENCOUNTER — Ambulatory Visit: Payer: Managed Care, Other (non HMO) | Admitting: Pulmonary Disease

## 2012-04-13 ENCOUNTER — Ambulatory Visit (INDEPENDENT_AMBULATORY_CARE_PROVIDER_SITE_OTHER): Payer: Managed Care, Other (non HMO) | Admitting: Pulmonary Disease

## 2012-04-13 ENCOUNTER — Encounter: Payer: Self-pay | Admitting: Pulmonary Disease

## 2012-04-13 VITALS — BP 122/78 | HR 50 | Temp 97.6°F | Ht 70.0 in | Wt 247.0 lb

## 2012-04-13 DIAGNOSIS — G4730 Sleep apnea, unspecified: Secondary | ICD-10-CM

## 2012-04-13 NOTE — Assessment & Plan Note (Signed)
Ct cpap 9 cm  Weight loss encouraged, compliance with goal of at least 4-6 hrs every night is the expectation. Advised against medications with sedative side effects Cautioned against driving when sleepy - understanding that sleepiness will vary on a day to day basis  

## 2012-04-13 NOTE — Progress Notes (Signed)
  Subjective:    Patient ID: Raymond Howell, male    DOB: [birth year], 51 y.o.   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  He had a sleep evaluation 2004 (205 lbs )- AHI was 12/ , did not need treatment. He has gained 35 lbs since then & his wife has noted loud snoring & witnessed apneas.  12/11 PSG (240 lbs ) >> AHI 22/h , lowest desatn 87%, PLMs disappeared with PAP. Central apneas emerged at 11 cm & persisted on BiPAP 13/9.  Tolerated CPAP 9 cm well, full face mask -   04/13/2012 download 04/19/10 - 01/07/11 >> few residual hypopneas on 10 cm. Note that he had centrals during titration on 11 cm  Given that he was symptomatically better , did not increase pressure.  pressure ok, mask ok, no dryness,Uses humidity only in winter.  Good compliance per report. Able to drive long distances without sleepiness.  There is no history suggestive of cataplexy, sleep paralysis or parasomnias     Review of Systems neg for any significant sore throat, dysphagia, itching, sneezing, nasal congestion or excess/ purulent secretions, fever, chills, sweats, unintended wt loss, pleuritic or exertional cp, hempoptysis, orthopnea pnd or change in chronic leg swelling. Also denies presyncope, palpitations, heartburn, abdominal pain, nausea, vomiting, diarrhea or change in bowel or urinary habits, dysuria,hematuria, rash, arthralgias, visual complaints, headache, numbness weakness or ataxia.     Objective:   Physical Exam   Gen. Pleasant, obese, in no distress ENT - no lesions, no post nasal drip Neck: No JVD, no thyromegaly, no carotid bruits Lungs: no use of accessory muscles, no dullness to percussion, decreased without rales or rhonchi  Cardiovascular: Rhythm regular, heart sounds  normal, no murmurs or gallops, no peripheral edema Musculoskeletal: No deformities, no cyanosis or clubbing , no tremors        Assessment & Plan:

## 2012-04-13 NOTE — Patient Instructions (Addendum)
We will look at download & provide feedback

## 2012-04-14 ENCOUNTER — Telehealth: Payer: Self-pay | Admitting: Pulmonary Disease

## 2012-04-14 NOTE — Telephone Encounter (Signed)
Download 11/13 shows very few residual events (4/h) -better than before on 10 cm - no changes

## 2012-04-15 NOTE — Telephone Encounter (Signed)
I spoke with patient about results and he verbalized understanding and had no questions 

## 2012-07-09 ENCOUNTER — Encounter: Payer: Self-pay | Admitting: Family Medicine

## 2012-07-09 DIAGNOSIS — I10 Essential (primary) hypertension: Secondary | ICD-10-CM

## 2012-07-09 MED ORDER — METOPROLOL SUCCINATE ER 100 MG PO TB24: 100.0000 mg | ORAL_TABLET | Freq: Every day | ORAL | Status: DC

## 2012-08-30 ENCOUNTER — Other Ambulatory Visit (INDEPENDENT_AMBULATORY_CARE_PROVIDER_SITE_OTHER): Payer: Managed Care, Other (non HMO)

## 2012-08-30 DIAGNOSIS — E785 Hyperlipidemia, unspecified: Secondary | ICD-10-CM

## 2012-08-30 LAB — HEPATIC FUNCTION PANEL
Albumin: 4.3 g/dL (ref 3.5–5.2)
Alkaline Phosphatase: 54 U/L (ref 39–117)
Total Protein: 7 g/dL (ref 6.0–8.3)

## 2012-08-30 LAB — BASIC METABOLIC PANEL
Calcium: 9 mg/dL (ref 8.4–10.5)
Chloride: 105 mEq/L (ref 96–112)
Creatinine, Ser: 0.9 mg/dL (ref 0.4–1.5)

## 2012-08-30 LAB — LIPID PANEL
Cholesterol: 118 mg/dL (ref 0–200)
LDL Cholesterol: 60 mg/dL (ref 0–99)
Triglycerides: 97 mg/dL (ref 0.0–149.0)

## 2012-09-06 ENCOUNTER — Encounter: Payer: Self-pay | Admitting: *Deleted

## 2012-12-09 ENCOUNTER — Encounter: Payer: Self-pay | Admitting: Internal Medicine

## 2013-01-11 ENCOUNTER — Encounter: Payer: Self-pay | Admitting: Family Medicine

## 2013-01-11 ENCOUNTER — Other Ambulatory Visit: Payer: Self-pay | Admitting: General Practice

## 2013-01-11 ENCOUNTER — Ambulatory Visit (INDEPENDENT_AMBULATORY_CARE_PROVIDER_SITE_OTHER): Payer: Managed Care, Other (non HMO) | Admitting: Family Medicine

## 2013-01-11 VITALS — BP 132/78 | HR 59 | Temp 98.7°F | Wt 249.6 lb

## 2013-01-11 DIAGNOSIS — M549 Dorsalgia, unspecified: Secondary | ICD-10-CM

## 2013-01-11 MED ORDER — MELOXICAM 15 MG PO TABS: ORAL_TABLET | ORAL | Status: DC

## 2013-01-11 MED ORDER — METAXALONE 800 MG PO TABS: 800.0000 mg | ORAL_TABLET | Freq: Four times a day (QID) | ORAL | Status: DC

## 2013-01-11 NOTE — Progress Notes (Signed)
  Subjective:    Raymond Howell is a 52 y.o. male who presents for evaluation of low back pain. The patient has had recurrent self limited episodes of low back pain in the past. Symptoms have been present for 2 weeks and are not improving.  Onset was related to / precipitated by moving his son to college. The pain is located in the right lumbar area and radiates to the right thigh, left thigh. The pain is described as burning and sharp and occurs intermittently. He rates his pain as severe. Symptoms are exacerbated by twisting. Symptoms are improved by muscle relaxants and NSAIDs. He has also tried nothing which provided no symptom relief. He has no other symptoms associated with the back pain. The patient has no "red flag" history indicative of complicated back pain.  The following portions of the patient's history were reviewed and updated as appropriate: allergies, current medications, past family history, past medical history, past social history, past surgical history and problem list.  Review of Systems Pertinent items are noted in HPI.    Objective:   Inspection and palpation: paraspinal tenderness noted R low back, antalgic gait. Muscle tone and ROM exam: muscle spasm noted R low back, full range of motion with pain, antalgic gait. Straight leg raise: negative at 45 degrees bilaterally. Neurological: normal DTRs, muscle strength and reflexes.    Assessment:    Nonspecific acute low back pain    Plan:    Natural history and expected course discussed. Questions answered. Short (2-4 day) period of relative rest recommended until acute symptoms improve. Ice to affected area as needed for local pain relief. Heat to affected area as needed for local pain relief. Muscle relaxants per medication orders. Follow-up in 2 weeks. mobic 15

## 2013-01-11 NOTE — Patient Instructions (Signed)
Back Pain, Adult  Low back pain is very common. About 1 in 5 people have back pain. The cause of low back pain is rarely dangerous. The pain often gets better over time. About half of people with a sudden onset of back pain feel better in just 2 weeks. About 8 in 10 people feel better by 6 weeks.   CAUSES  Some common causes of back pain include:  · Strain of the muscles or ligaments supporting the spine.  · Wear and tear (degeneration) of the spinal discs.  · Arthritis.  · Direct injury to the back.  DIAGNOSIS  Most of the time, the direct cause of low back pain is not known. However, back pain can be treated effectively even when the exact cause of the pain is unknown. Answering your caregiver's questions about your overall health and symptoms is one of the most accurate ways to make sure the cause of your pain is not dangerous. If your caregiver needs more information, he or she may order lab work or imaging tests (X-rays or MRIs). However, even if imaging tests show changes in your back, this usually does not require surgery.  HOME CARE INSTRUCTIONS  For many people, back pain returns. Since low back pain is rarely dangerous, it is often a condition that people can learn to manage on their own.   · Remain active. It is stressful on the back to sit or stand in one place. Do not sit, drive, or stand in one place for more than 30 minutes at a time. Take short walks on level surfaces as soon as pain allows. Try to increase the length of time you walk each day.  · Do not stay in bed. Resting more than 1 or 2 days can delay your recovery.  · Do not avoid exercise or work. Your body is made to move. It is not dangerous to be active, even though your back may hurt. Your back will likely heal faster if you return to being active before your pain is gone.  · Pay attention to your body when you  bend and lift. Many people have less discomfort when lifting if they bend their knees, keep the load close to their bodies, and  avoid twisting. Often, the most comfortable positions are those that put less stress on your recovering back.  · Find a comfortable position to sleep. Use a firm mattress and lie on your side with your knees slightly bent. If you lie on your back, put a pillow under your knees.  · Only take over-the-counter or prescription medicines as directed by your caregiver. Over-the-counter medicines to reduce pain and inflammation are often the most helpful. Your caregiver may prescribe muscle relaxant drugs. These medicines help dull your pain so you can more quickly return to your normal activities and healthy exercise.  · Put ice on the injured area.  · Put ice in a plastic bag.  · Place a towel between your skin and the bag.  · Leave the ice on for 15-20 minutes, 3-4 times a day for the first 2 to 3 days. After that, ice and heat may be alternated to reduce pain and spasms.  · Ask your caregiver about trying back exercises and gentle massage. This may be of some benefit.  · Avoid feeling anxious or stressed. Stress increases muscle tension and can worsen back pain. It is important to recognize when you are anxious or stressed and learn ways to manage it. Exercise is a great option.  SEEK MEDICAL CARE IF:  · You have pain that is not relieved with rest or   medicine.  · You have pain that does not improve in 1 week.  · You have new symptoms.  · You are generally not feeling well.  SEEK IMMEDIATE MEDICAL CARE IF:   · You have pain that radiates from your back into your legs.  · You develop new bowel or bladder control problems.  · You have unusual weakness or numbness in your arms or legs.  · You develop nausea or vomiting.  · You develop abdominal pain.  · You feel faint.  Document Released: 04/28/2005 Document Revised: 10/28/2011 Document Reviewed: 09/16/2010  [employer]® Patient Information ©2014 [employer].

## 2013-01-13 ENCOUNTER — Other Ambulatory Visit: Payer: Self-pay | Admitting: Family Medicine

## 2013-01-13 ENCOUNTER — Encounter: Payer: Self-pay | Admitting: Family Medicine

## 2013-01-13 DIAGNOSIS — M549 Dorsalgia, unspecified: Secondary | ICD-10-CM

## 2013-01-13 MED ORDER — METAXALONE 800 MG PO TABS: 800.0000 mg | ORAL_TABLET | Freq: Four times a day (QID) | ORAL | Status: DC

## 2013-02-07 MED ORDER — MELOXICAM 15 MG PO TABS: ORAL_TABLET | ORAL | Status: DC

## 2013-03-03 ENCOUNTER — Telehealth: Payer: Self-pay

## 2013-03-03 NOTE — Telephone Encounter (Signed)
Medication List and allergies: reviewed and updated   90 day supply/mail order: na Local prescriptions: [facility] [address]  Immunizations due: admin flu vaccine upon arrival  A/P: Flu vaccine this visit      To Discuss with Provider: Had CCS at 46--all neg--received letter from [facility] to have another one. Questions if he should?? No indications in chart that it should be sooner than 10 years

## 2013-03-07 ENCOUNTER — Encounter: Payer: Managed Care, Other (non HMO) | Admitting: Family Medicine

## 2013-03-08 ENCOUNTER — Ambulatory Visit (INDEPENDENT_AMBULATORY_CARE_PROVIDER_SITE_OTHER): Payer: Managed Care, Other (non HMO) | Admitting: Family Medicine

## 2013-03-08 ENCOUNTER — Encounter: Payer: Self-pay | Admitting: Family Medicine

## 2013-03-08 VITALS — BP 122/80 | HR 49 | Temp 98.5°F | Ht 70.0 in | Wt 251.8 lb

## 2013-03-08 DIAGNOSIS — R739 Hyperglycemia, unspecified: Secondary | ICD-10-CM

## 2013-03-08 DIAGNOSIS — E785 Hyperlipidemia, unspecified: Secondary | ICD-10-CM

## 2013-03-08 DIAGNOSIS — R7309 Other abnormal glucose: Secondary | ICD-10-CM

## 2013-03-08 DIAGNOSIS — Z0000 Encounter for general adult medical examination without abnormal findings: Secondary | ICD-10-CM

## 2013-03-08 DIAGNOSIS — E669 Obesity, unspecified: Secondary | ICD-10-CM | POA: Insufficient documentation

## 2013-03-08 DIAGNOSIS — R079 Chest pain, unspecified: Secondary | ICD-10-CM

## 2013-03-08 DIAGNOSIS — Z23 Encounter for immunization: Secondary | ICD-10-CM

## 2013-03-08 DIAGNOSIS — Z8249 Family history of ischemic heart disease and other diseases of the circulatory system: Secondary | ICD-10-CM

## 2013-03-08 DIAGNOSIS — I10 Essential (primary) hypertension: Secondary | ICD-10-CM

## 2013-03-08 DIAGNOSIS — M549 Dorsalgia, unspecified: Secondary | ICD-10-CM

## 2013-03-08 LAB — HEMOGLOBIN A1C: Hgb A1c MFr Bld: 5 % (ref 4.6–6.5)

## 2013-03-08 MED ORDER — METAXALONE 800 MG PO TABS: 800.0000 mg | ORAL_TABLET | Freq: Four times a day (QID) | ORAL | Status: DC

## 2013-03-08 MED ORDER — METOPROLOL SUCCINATE ER 100 MG PO TB24: 100.0000 mg | ORAL_TABLET | Freq: Every day | ORAL | Status: DC

## 2013-03-08 MED ORDER — MELOXICAM 15 MG PO TABS: 15.0000 mg | ORAL_TABLET | Freq: Every day | ORAL | Status: DC

## 2013-03-08 MED ORDER — NIACIN-SIMVASTATIN ER 1000-40 MG PO TB24: ORAL_TABLET | ORAL | Status: DC

## 2013-03-08 NOTE — Assessment & Plan Note (Signed)
Stable Cont meds 

## 2013-03-08 NOTE — Assessment & Plan Note (Signed)
Check labs Cont' meds 

## 2013-03-08 NOTE — Patient Instructions (Signed)
Preventive Care for Adults, Male  A healthy lifestyle and preventive care can promote health and wellness. Preventive health guidelines for men include the following key practices:  · A routine yearly physical is a good way to check with your caregiver about your health and preventative screening. It is a chance to share any concerns and updates on your health, and to receive a thorough exam.  · Visit your dentist for a routine exam and preventative care every 6 months. Brush your teeth twice a day and floss once a day. Good oral hygiene prevents tooth decay and gum disease.  · The frequency of eye exams is based on your age, health, family medical history, use of contact lenses, and other factors. Follow your caregiver's recommendations for frequency of eye exams.  · Eat a healthy diet. Foods like vegetables, fruits, whole grains, low-fat dairy products, and lean protein foods contain the nutrients you need without too many calories. Decrease your intake of foods high in solid fats, added sugars, and salt. Eat the right amount of calories for you. Get information about a proper diet from your caregiver, if necessary.  · Regular physical exercise is one of the most important things you can do for your health. Most adults should get at least 150 minutes of moderate-intensity exercise (any activity that increases your heart rate and causes you to sweat) each week. In addition, most adults need muscle-strengthening exercises on 2 or more days a week.  · Maintain a healthy weight. The body mass index (BMI) is a screening tool to identify possible weight problems. It provides an estimate of body fat based on height and weight. Your caregiver can help determine your BMI, and can help you achieve or maintain a healthy weight. For adults 20 years and older:  · A BMI below 18.5 is considered underweight.  · A BMI of 18.5 to 24.9 is normal.  · A BMI of 25 to 29.9 is considered overweight.  · A BMI of 30 and above is  considered obese.  · Maintain normal blood lipids and cholesterol levels by exercising and minimizing your intake of saturated fat. Eat a balanced diet with plenty of fruit and vegetables. Blood tests for lipids and cholesterol should begin at age 20 and be repeated every 5 years. If your lipid or cholesterol levels are high, you are over 50, or you are a high risk for heart disease, you may need your cholesterol levels checked more frequently. Ongoing high lipid and cholesterol levels should be treated with medicines if diet and exercise are not effective.  · If you smoke, find out from your caregiver how to quit. If you do not use tobacco, do not start.  · If you choose to drink alcohol, do not exceed 2 drinks per day. One drink is considered to be 12 ounces (355 mL) of beer, 5 ounces (148 mL) of wine, or 1.5 ounces (44 mL) of liquor.  · Avoid use of street drugs. Do not share needles with anyone. Ask for help if you need support or instructions about stopping the use of drugs.  · High blood pressure causes heart disease and increases the risk of stroke. Your blood pressure should be checked at least every 1 to 2 years. Ongoing high blood pressure should be treated with medicines, if weight loss and exercise are not effective.  · If you are 45 to 53 years old, ask your caregiver if you should take aspirin to prevent heart disease.  · Diabetes screening involves taking   a blood sample to check your fasting blood sugar level. This should be done once every 3 years, after age 45, if you are within normal weight and without risk factors for diabetes. Testing should be considered at a younger age or be carried out more frequently if you are overweight and have at least 1 risk factor for diabetes.  · Colorectal cancer can be detected and often prevented. Most routine colorectal cancer screening begins at the age of 50 and continues through age 75. However, your caregiver may recommend screening at an earlier age if you  have risk factors for colon cancer. On a yearly basis, your caregiver may provide home test kits to check for hidden blood in the stool. Use of a small camera at the end of a tube, to directly examine the colon (sigmoidoscopy or colonoscopy), can detect the earliest forms of colorectal cancer. Talk to your caregiver about this at age 50, when routine screening begins.  Direct examination of the colon should be repeated every 5 to 10 years through age 75, unless early forms of pre-cancerous polyps or small growths are found.  · Hepatitis C blood testing is recommended for all people born from 1945 through 1965 and any individual with known risks for hepatitis C.  · Practice safe sex. Use condoms and avoid high-risk sexual practices to reduce the spread of sexually transmitted infections (STIs). STIs include gonorrhea, chlamydia, syphilis, trichomonas, herpes, HPV, and human immunodeficiency virus (HIV). Herpes, HIV, and HPV are viral illnesses that have no cure. They can result in disability, cancer, and death.  · A one-time screening for abdominal aortic aneurysm (AAA) and surgical repair of large AAAs by sound wave imaging (ultrasonography) is recommended for ages 65 to 75 years who are current or former smokers.  · Healthy men should no longer receive prostate-specific antigen (PSA) blood tests as part of routine cancer screening. Consult with your caregiver about prostate cancer screening.  · Testicular cancer screening is not recommended for adult males who have no symptoms. Screening includes self-exam, caregiver exam, and other screening tests. Consult with your caregiver about any symptoms you have or any concerns you have about testicular cancer.  · Use sunscreen with skin protection factor (SPF) of 30 or more. Apply sunscreen liberally and repeatedly throughout the day. You should seek shade when your shadow is shorter than you. Protect yourself by wearing long sleeves, pants, a wide-brimmed hat, and  sunglasses year round, whenever you are outdoors.  · Once a month, do a whole body skin exam, using a mirror to look at the skin on your back. Notify your caregiver of new moles, moles that have irregular borders, moles that are larger than a pencil eraser, or moles that have changed in shape or color.  · Stay current with required immunizations.  · Influenza. You need a dose every fall (or winter). The composition of the flu vaccine changes each year, so being vaccinated once is not enough.  · Pneumococcal polysaccharide. You need 1 to 2 doses if you smoke cigarettes or if you have certain chronic medical conditions. You need 1 dose at age 65 (or older) if you have never been vaccinated.  · Tetanus, diphtheria, pertussis (Tdap, Td). Get 1 dose of Tdap vaccine if you are younger than age 65 years, are over 65 and have contact with an infant, are a healthcare worker, or simply want to be protected from whooping cough. After that, you need a Td booster dose every 10 years. Consult your   caregiver if you have not had at least 3 tetanus and diphtheria-containing shots sometime in your life or have a deep or dirty wound.  · HPV. This vaccine is recommended for males 13 through 53 years of age. This vaccine may be given to men 22 through 53 years of age who have not completed the 3 dose series. It is recommended for men through age 26 who have sex with men or whose immune system is weakened because of HIV infection, other illness, or medications. The vaccine is given in 3 doses over 6 months.  · Measles, mumps, rubella (MMR). You need at least 1 dose of MMR if you were born in 1957 or later. You may also need a 2nd dose.  · Meningococcal. If you are age 19 to 21 years and a first-year college student living in a residence hall, or have one of several medical conditions, you need to get vaccinated against meningococcal disease. You may also need additional booster doses.  · Zoster (shingles). If you are age 60 years or  older, you should get this vaccine.  · Varicella (chickenpox). If you have never had chickenpox or you were vaccinated but received only 1 dose, talk to your caregiver to find out if you need this vaccine.  · Hepatitis A. You need this vaccine if you have a specific risk factor for hepatitis A virus infection, or you simply wish to be protected from this disease. The vaccine is usually given as 2 doses, 6 to 18 months apart.  · Hepatitis B. You need this vaccine if you have a specific risk factor for hepatitis B virus infection or you simply wish to be protected from this disease. The vaccine is given in 3 doses, usually over 6 months.  Preventative Service / Frequency  Ages 19 to 39  · Blood pressure check.** / Every 1 to 2 years.  · Lipid and cholesterol check.** / Every 5 years beginning at age 20.  · Hepatitis C blood test.** / For any individual with known risks for hepatitis C.  · Skin self-exam. / Monthly.  · Influenza immunization.** / Every year.  · Pneumococcal polysaccharide immunization.** / 1 to 2 doses if you smoke cigarettes or if you have certain chronic medical conditions.  · Tetanus, diphtheria, pertussis (Tdap,Td) immunization. / A one-time dose of Tdap vaccine. After that, you need a Td booster dose every 10 years.  · HPV immunization. / 3 doses over 6 months, if 26 and younger.  · Measles, mumps, rubella (MMR) immunization. / You need at least 1 dose of MMR if you were born in 1957 or later. You may also need a 2nd dose.  · Meningococcal immunization. / 1 dose if you are age 19 to 21 years and a first-year college student living in a residence hall, or have one of several medical conditions, you need to get vaccinated against meningococcal disease. You may also need additional booster doses.  · Varicella immunization.** / Consult your caregiver.  · Hepatitis A immunization.** / Consult your caregiver. 2 doses, 6 to 18 months apart.  · Hepatitis B immunization.** / Consult your caregiver. 3 doses  usually over 6 months.  Ages 40 to 64  · Blood pressure check.** / Every 1 to 2 years.  · Lipid and cholesterol check.** / Every 5 years beginning at age 20.  · Fecal occult blood test (FOBT) of stool. / Every year beginning at age 50 and continuing until age 75. You may not have   to do this test if you get colonoscopy every 10 years.  · Flexible sigmoidoscopy** or colonoscopy.** / Every 5 years for a flexible sigmoidoscopy or every 10 years for a colonoscopy beginning at age 50 and continuing until age 75.  · Hepatitis C blood test.** / For all people born from 1945 through 1965 and any individual with known risks for hepatitis C.  · Skin self-exam. / Monthly.  · Influenza immunization.** / Every year.  · Pneumococcal polysaccharide immunization.** / 1 to 2 doses if you smoke cigarettes or if you have certain chronic medical conditions.  · Tetanus, diphtheria, pertussis (Tdap/Td) immunization.** / A one-time dose of Tdap vaccine. After that, you need a Td booster dose every 10 years.  · Measles, mumps, rubella (MMR) immunization.  / You need at least 1 dose of MMR if you were born in 1957 or later. You may also need a 2nd dose.  · Varicella immunization.**/ Consult your caregiver.  · Meningococcal immunization.** / Consult your caregiver.  · Hepatitis A immunization.** / Consult your caregiver. 2 doses, 6 to 18 months apart.  · Hepatitis B immunization.** / Consult your caregiver. 3 doses, usually over 6 months.  Ages 65 and over  · Blood pressure check.** / Every 1 to 2 years.  · Lipid and cholesterol check.**/ Every 5 years beginning at age 20.  · Fecal occult blood test (FOBT) of stool. / Every year beginning at age 50 and continuing until age 75. You may not have to do this test if you get colonoscopy every 10 years.  · Flexible sigmoidoscopy** or colonoscopy.** / Every 5 years for a flexible sigmoidoscopy or every 10 years for a colonoscopy beginning at age 50 and continuing until age 75.  · Hepatitis C blood  test.** / For all people born from 1945 through 1965 and any individual with known risks for hepatitis C.  · Abdominal aortic aneurysm (AAA) screening.** / A one-time screening for ages 65 to 75 years who are current or former smokers.  · Skin self-exam. / Monthly.  · Influenza immunization.** / Every year.  · Pneumococcal polysaccharide immunization.** / 1 dose at age 65 (or older) if you have never been vaccinated.  · Tetanus, diphtheria, pertussis (Tdap, Td) immunization. / A one-time dose of Tdap vaccine if you are over 65 and have contact with an infant, are a healthcare worker, or simply want to be protected from whooping cough. After that, you need a Td booster dose every 10 years.  · Varicella immunization. ** / Consult your caregiver.  · Meningococcal immunization.** / Consult your caregiver.  · Hepatitis A immunization. ** / Consult your caregiver. 2 doses, 6 to 18 months apart.  · Hepatitis B immunization.** / Check with your caregiver. 3 doses, usually over 6 months.  **Family history and personal history of risk and conditions may change your caregiver's recommendations.  Document Released: 06/24/2001 Document Revised: 07/21/2011 Document Reviewed: 09/23/2010  [employer]® Patient Information ©2014 [employer].

## 2013-03-08 NOTE — Progress Notes (Signed)
Subjective:    Patient ID: Raymond Howell, male    DOB: 05-23-1960, 52 y.o.   MRN: [id number]  HPI Pt here for cpe and labs.  No complaints.       Review of Systems Review of Systems  Constitutional: Negative for activity change, appetite change and fatigue.  HENT: Negative for hearing loss, congestion, tinnitus and ear discharge.  dentist q29m Eyes: Negative for visual disturbance (see optho q1y -- due) Respiratory: Negative for cough, chest tightness and shortness of breath.   Cardiovascular: Negative for chest pain, palpitations and leg swelling.  Gastrointestinal: Negative for abdominal pain, diarrhea, constipation and abdominal distention.  Genitourinary: Negative for urgency, frequency, decreased urine volume and difficulty urinating.  Musculoskeletal: Negative for back pain, arthralgias and gait problem.  Skin: Negative for color change, pallor and rash.  Neurological: Negative for dizziness, light-headedness, numbness and headaches.  Hematological: Negative for adenopathy. Does not bruise/bleed easily.  Psychiatric/Behavioral: Negative for suicidal ideas, confusion, sleep disturbance, self-injury, dysphoric mood, decreased concentration and agitation.   Past Medical History  Diagnosis Date  . Basal cell carcinoma of nose   . Umbilical hernia   . Venereal wart   . Hyperlipidemia   . Hypertension    History   Social History  . Marital Status: Married    Spouse Name: N/A    Number of Children: N/A  . Years of Education: N/A   Occupational History  . Not on file.   Social History Main Topics  . Smoking status: Never Smoker   . Smokeless tobacco: Never Used  . Alcohol Use: Yes  . Drug Use: No  . Sexual Activity: Yes    Partners: Female   Other Topics Concern  . Not on file   Social History Narrative   Reg exercise--- walking the dogs   Family History  Problem Relation Age of Onset  . Heart disease Father 28    MI  . Coronary artery disease Other     1st  degree relative  . Cancer Other     skin   Current outpatient prescriptions:aspirin 81 MG tablet, Take 81 mg by mouth daily.  , Disp: , Rfl: ;  Flaxseed, Linseed, (FLAXSEED OIL) 1000 MG CAPS, Take by mouth 2 (two) times daily.  , Disp: , Rfl: ;  ibuprofen (ADVIL,MOTRIN) 200 MG tablet, Take 800 mg by mouth every 6 (six) hours as needed for pain., Disp: , Rfl: ;  metoprolol succinate (TOPROL-XL) 100 MG 24 hr tablet, Take 1 tablet (100 mg total) by mouth daily., Disp: 90 tablet, Rfl: 3 Multiple Vitamin (MULTIVITAMIN) tablet, Take 1 tablet by mouth daily.  , Disp: , Rfl: ;  Niacin-Simvastatin (SIMCOR) 1000-40 MG TB24, 1 po qhs, Disp: 90 tablet, Rfl: 3;  meloxicam (MOBIC) 15 MG tablet, Take 1 tablet (15 mg total) by mouth daily., Disp: 30 tablet, Rfl: 2;  metaxalone (SKELAXIN) 800 MG tablet, Take 1 tablet (800 mg total) by mouth 4 (four) times daily., Disp: 30 tablet, Rfl: 2       Objective:   Physical Exam  BP 122/80  Pulse 49  Temp(Src) 98.5 F (36.9 C) (Oral)  Ht 5\' 10"  (1.778 m)  Wt 251 lb 12.8 oz (114.216 kg)  BMI 36.13 kg/m2  SpO2 97% General appearance: alert, cooperative, appears stated age and no distress Head: Normocephalic, without obvious abnormality, atraumatic Eyes: conjunctivae/corneas clear. PERRL, EOM's intact. Fundi benign. Ears: normal TM's and external ear canals both ears Nose: Nares normal. Septum midline. Mucosa normal. No drainage  or sinus tenderness. Throat: lips, mucosa, and tongue normal; teeth and gums normal Neck: no adenopathy, no carotid bruit, no JVD, supple, symmetrical, trachea midline and thyroid not enlarged, symmetric, no tenderness/mass/nodules Back: symmetric, no curvature. ROM normal. No CVA tenderness. Lungs: clear to auscultation bilaterally Chest wall: no tenderness Heart: S1, S2 normal Abdomen: soft, non-tender; bowel sounds normal; no masses,  no organomegaly Male genitalia: normal, penis: no lesions or discharge. testes: no masses or  tenderness. no hernias Rectal: normal tone, normal prostate, no masses or tenderness and soft brown guaiac negative stool noted Extremities: extremities normal, atraumatic, no cyanosis or edema Pulses: 2+ and symmetric Skin: Skin color, texture, turgor normal. No rashes or lesions Lymph nodes: Cervical, supraclavicular, and axillary nodes normal. Neurologic: Alert and oriented X 3, normal strength and tone. Normal symmetric reflexes. Normal coordination and gait Psych- no depression, no anxiety      Assessment & Plan:  cpe--ghm utd        Check labs        See avs

## 2013-03-09 ENCOUNTER — Ambulatory Visit: Payer: Managed Care, Other (non HMO) | Admitting: Cardiovascular Disease

## 2013-03-09 ENCOUNTER — Other Ambulatory Visit: Payer: Self-pay | Admitting: Family Medicine

## 2013-03-09 ENCOUNTER — Telehealth: Payer: Self-pay | Admitting: Family Medicine

## 2013-03-09 DIAGNOSIS — Z0000 Encounter for general adult medical examination without abnormal findings: Secondary | ICD-10-CM

## 2013-03-09 NOTE — Telephone Encounter (Signed)
Message copied by Gerrianne Scale on Wed Mar 09, 2013 10:27 AM ------      Message from: Lelon Perla      Created: Tue Mar 08, 2013  5:04 PM       Pt was here for physical today.  I accidentally only ordered [id number]      By any chance were more tubes drawn.  I need the rest of the physical labs ------

## 2013-03-09 NOTE — Telephone Encounter (Signed)
Sorry we throw all blood away at the end of each person shift and the end of the day

## 2013-03-09 NOTE — Telephone Encounter (Signed)
Apologize to pt --- my fault.  Order was not put in before he got back to lab.    Ask if he can come at his convenience to draw labs---order in.

## 2013-03-09 NOTE — Telephone Encounter (Signed)
Please call the patient and schedule a lab apt.Dr.Lowne did not order his labs until today so only the A1c was drawn.    KP

## 2013-03-10 ENCOUNTER — Encounter: Payer: Self-pay | Admitting: Family Medicine

## 2013-03-11 ENCOUNTER — Other Ambulatory Visit (INDEPENDENT_AMBULATORY_CARE_PROVIDER_SITE_OTHER): Payer: Managed Care, Other (non HMO)

## 2013-03-11 DIAGNOSIS — Z0000 Encounter for general adult medical examination without abnormal findings: Secondary | ICD-10-CM

## 2013-03-11 LAB — POCT URINALYSIS DIPSTICK
Glucose, UA: NEGATIVE
Ketones, UA: NEGATIVE
Leukocytes, UA: NEGATIVE
Protein, UA: NEGATIVE
Urobilinogen, UA: 0.2

## 2013-03-11 LAB — BASIC METABOLIC PANEL
BUN: 14 mg/dL (ref 6–23)
CO2: 29 mEq/L (ref 19–32)
Calcium: 9.4 mg/dL (ref 8.4–10.5)
Chloride: 102 mEq/L (ref 96–112)
Creatinine, Ser: 0.8 mg/dL (ref 0.4–1.5)
Sodium: 138 mEq/L (ref 135–145)

## 2013-03-11 LAB — HEPATIC FUNCTION PANEL
ALT: 36 U/L (ref 0–53)
Alkaline Phosphatase: 52 U/L (ref 39–117)
Bilirubin, Direct: 0.3 mg/dL (ref 0.0–0.3)
Total Protein: 7 g/dL (ref 6.0–8.3)

## 2013-03-11 LAB — CBC WITH DIFFERENTIAL/PLATELET
Basophils Absolute: 0 10*3/uL (ref 0.0–0.1)
Basophils Relative: 0.4 % (ref 0.0–3.0)
Eosinophils Absolute: 0.2 10*3/uL (ref 0.0–0.7)
Eosinophils Relative: 2.1 % (ref 0.0–5.0)
Lymphocytes Relative: 24.7 % (ref 12.0–46.0)
MCHC: 34.6 g/dL (ref 30.0–36.0)
Neutrophils Relative %: 65.8 % (ref 43.0–77.0)
RBC: 4.74 Mil/uL (ref 4.22–5.81)
WBC: 8.1 10*3/uL (ref 4.5–10.5)

## 2013-03-11 LAB — PSA: PSA: 1.06 ng/mL (ref 0.10–4.00)

## 2013-03-11 LAB — TSH: TSH: 1.69 u[IU]/mL (ref 0.35–5.50)

## 2013-03-11 LAB — LIPID PANEL
Cholesterol: 128 mg/dL (ref 0–200)
VLDL: 22.2 mg/dL (ref 0.0–40.0)

## 2013-03-28 ENCOUNTER — Encounter: Payer: Self-pay | Admitting: Family Medicine

## 2013-04-05 ENCOUNTER — Encounter: Payer: Self-pay | Admitting: Cardiovascular Disease

## 2013-04-05 ENCOUNTER — Ambulatory Visit (INDEPENDENT_AMBULATORY_CARE_PROVIDER_SITE_OTHER): Payer: Managed Care, Other (non HMO) | Admitting: Cardiovascular Disease

## 2013-04-05 VITALS — BP 140/88 | HR 56 | Ht 69.0 in | Wt 250.2 lb

## 2013-04-05 DIAGNOSIS — R079 Chest pain, unspecified: Secondary | ICD-10-CM

## 2013-04-05 DIAGNOSIS — E785 Hyperlipidemia, unspecified: Secondary | ICD-10-CM

## 2013-04-05 DIAGNOSIS — I10 Essential (primary) hypertension: Secondary | ICD-10-CM

## 2013-04-05 NOTE — Assessment & Plan Note (Signed)
Excellent fasting lipid profile on statin drugs. Verticillata performed 03/11/13 revealed a glucose of 120, LDL 65 HDL of 40

## 2013-04-05 NOTE — Patient Instructions (Signed)
Your physician has requested that you have en exercise stress myoview. For further information please visit [email or web address] Please follow instruction sheet, as given.  Your physician recommends that you schedule a follow-up appointment after your stress test, in a few weeks.

## 2013-04-05 NOTE — Progress Notes (Signed)
04/05/2013 Raymond Howell   03-04-1961  [id number]  Primary Physician Raymond Freud, DO Primary Cardiologist: Raymond Gess MD Raymond Howell   HPI:  Mr. 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 Primary care physician is Dr. Loreen Howell . He was referred because of chest pain. His cardiac risk factor profile is remarkable for family history the father that had his first MI at age 18 and died of a heart attack at age 29. History of hypertension and hyperlipidemia. He does not smoke nor is he diabetic. He developed substernal chest pain of the left ear occurring 5-6 times left hip tenderness the time not associated with any other activities.   Current Outpatient Prescriptions  Medication Sig Dispense Refill  . aspirin 81 MG tablet Take 81 mg by mouth daily.        . Flaxseed, Linseed, (FLAXSEED OIL) 1000 MG CAPS Take by mouth 2 (two) times daily.        Marland Kitchen ibuprofen (ADVIL,MOTRIN) 200 MG tablet Take 800 mg by mouth every 6 (six) hours as needed for pain.      . meloxicam (MOBIC) 15 MG tablet Take 1 tablet (15 mg total) by mouth daily.  30 tablet  2  . metaxalone (SKELAXIN) 800 MG tablet Take 1 tablet (800 mg total) by mouth 4 (four) times daily.  30 tablet  2  . metoprolol succinate (TOPROL-XL) 100 MG 24 hr tablet Take 1 tablet (100 mg total) by mouth daily.  90 tablet  3  . Multiple Vitamin (MULTIVITAMIN) tablet Take 1 tablet by mouth daily.        . Niacin-Simvastatin (SIMCOR) 1000-40 MG TB24 1 po qhs  90 tablet  3  . NON FORMULARY at bedtime. CPAP       No current facility-administered medications for this visit.    No Known Allergies  History   Social History  . Marital Status: Married    Spouse Name: N/A    Number of Children: N/A  . Years of Education: N/A   Occupational History  . Not on file.   Social History Main Topics  . Smoking  status: Never Smoker   . Smokeless tobacco: Never Used  . Alcohol Use: 1.5 - 2 oz/week    3-4 drink(s) per week  . Drug Use: No  . Sexual Activity: Yes    Partners: Female   Other Topics Concern  . Not on file   Social History Narrative   Reg exercise--- walking the dogs     Review of Systems: General: negative for chills, fever, night sweats or weight changes.  Cardiovascular: negative for chest pain, dyspnea on exertion, edema, orthopnea, palpitations, paroxysmal nocturnal dyspnea or shortness of breath Dermatological: negative for rash Respiratory: negative for cough or wheezing Urologic: negative for hematuria Abdominal: negative for nausea, vomiting, diarrhea, bright red blood per rectum, melena, or hematemesis Neurologic: negative for visual changes, syncope, or dizziness All other systems reviewed and are otherwise negative except as noted above.    Blood pressure 140/88, pulse 56, height 5\' 9"  (1.753 m), weight 250 lb 3.2 oz (113.49 kg).  General appearance: alert and no distress Neck: no adenopathy, no carotid bruit, no JVD, supple, symmetrical, trachea midline and thyroid not enlarged, symmetric, no tenderness/mass/nodules Lungs: clear to auscultation bilaterally Heart: regular rate and rhythm, S1, S2 normal, no murmur, click, rub or gallop Extremities: extremities  normal, atraumatic, no cyanosis or edema  EKG sinus bradycardia at 56 without ST or T wave changes  ASSESSMENT AND PLAN:   Chest pain Patient has complained of chest pain the 5-6 times the last year. They last up to 10 minutes at a time and does not radiate. Not necessarily related to any activities. He does have risk factors including family history, hypertension and hyperlipidemia. He had a negative Myoview stress test in 2008. I'm going to repeat an exercise Myoview to rule out an ischemic etiology.  HYPERTENSION Under good control and her medications  HYPERLIPIDEMIA Excellent fasting lipid profile  on statin drugs. Verticillata performed 03/11/13 revealed a glucose of 120, LDL 65 HDL of 40      Raymond Gess MD [place] 04/05/2013 12:01 PM

## 2013-04-05 NOTE — Assessment & Plan Note (Signed)
Under good control and her medications 

## 2013-04-05 NOTE — Assessment & Plan Note (Signed)
Patient has complained of chest pain the 5-6 times the last year. They last up to 10 minutes at a time and does not radiate. Not necessarily related to any activities. He does have risk factors including family history, hypertension and hyperlipidemia. He had a negative Myoview stress test in 2008. I'm going to repeat an exercise Myoview to rule out an ischemic etiology.

## 2013-04-12 ENCOUNTER — Encounter (HOSPITAL_COMMUNITY): Payer: Managed Care, Other (non HMO)

## 2013-04-22 ENCOUNTER — Encounter: Payer: Self-pay | Admitting: Pulmonary Disease

## 2013-04-22 ENCOUNTER — Ambulatory Visit: Payer: Managed Care, Other (non HMO) | Admitting: Cardiovascular Disease

## 2013-04-22 ENCOUNTER — Ambulatory Visit (HOSPITAL_COMMUNITY)
Admission: RE | Admit: 2013-04-22 | Discharge: 2013-04-22 | Disposition: A | Discharge status: Home / Self Care | Payer: Managed Care, Other (non HMO) | Source: Ambulatory Visit | Attending: Cardiovascular Disease | Admitting: Cardiovascular Disease

## 2013-04-22 DIAGNOSIS — I10 Essential (primary) hypertension: Secondary | ICD-10-CM | POA: Insufficient documentation

## 2013-04-22 DIAGNOSIS — G4733 Obstructive sleep apnea (adult) (pediatric): Secondary | ICD-10-CM

## 2013-04-22 DIAGNOSIS — R42 Dizziness and giddiness: Secondary | ICD-10-CM | POA: Insufficient documentation

## 2013-04-22 DIAGNOSIS — R5381 Other malaise: Secondary | ICD-10-CM | POA: Insufficient documentation

## 2013-04-22 DIAGNOSIS — E669 Obesity, unspecified: Secondary | ICD-10-CM | POA: Insufficient documentation

## 2013-04-22 DIAGNOSIS — R079 Chest pain, unspecified: Secondary | ICD-10-CM | POA: Insufficient documentation

## 2013-04-22 DIAGNOSIS — Z8249 Family history of ischemic heart disease and other diseases of the circulatory system: Secondary | ICD-10-CM | POA: Insufficient documentation

## 2013-04-22 MED ORDER — TECHNETIUM TC 99M SESTAMIBI GENERIC - CARDIOLITE
31.2000 | Freq: Once | INTRAVENOUS | Status: AC | PRN
  Administered 2013-04-22: 31.2 via INTRAVENOUS

## 2013-04-22 MED ORDER — TECHNETIUM TC 99M SESTAMIBI GENERIC - CARDIOLITE
10.8000 | Freq: Once | INTRAVENOUS | Status: AC | PRN
  Administered 2013-04-22: 11 via INTRAVENOUS

## 2013-04-22 NOTE — Telephone Encounter (Signed)
Dr. Vassie Loll, this patient is coming in on 1-15 for 1 year follow-up and he is asking if a download is needed. He states that at last visit the download was not available at the time of his visit and he wants to make sure it is available at this visit. Dr. Vassie Loll, please advise if you would like a download? Carron Curie, CMA

## 2013-04-22 NOTE — Procedures (Addendum)
[place] [facility] [address] [place] [address] [place] [address] [phone]  Cardiology Nuclear Med Raymond Howell Raymond Howell is a 52 y.o. male     MRN : [id number]     DOB: Dec 24, 1960  Procedure Date: 04/22/2013  Nuclear Med Background Indication for Stress Test:  Evaluation for Ischemia History:  No prior history reported by patient. Cardiac Risk Factors: Family History - CAD, Hypertension, Lipids, Obesity and OSA  Symptoms:  Chest Pain, Fatigue and Light-Headedness   Nuclear Pre-Procedure Caffeine/Decaff Intake:  7:00pm NPO After: 5:00am   IV Site: R Hand  IV 0.9% NS with Angio Cath:  22g  Chest Size (in):  46"  IV Started by: Emmit Pomfret, RN  Height: 5\' 9"  (1.753 m)  Cup Size: n/a  BMI:  Body mass index is 36.9 kg/(m^2). Weight:  250 lb (113.399 kg)   Tech Comments:  n/a    Nuclear Med Study 1 or 2 day study: 1 day  Stress Test Type:  Stress  Order Authorizing Provider:  Nanetta Batty MD   Resting Radionuclide: Technetium 64m Sestamibi  Resting Radionuclide Dose: 10.8 mCi   Stress Radionuclide:  Technetium 33m Sestamibi  Stress Radionuclide Dose: 31.2 mCi           Stress Protocol Rest HR: 63 Stress HR:  166  Rest BP:  151/87 Stress BP:  210/78  Exercise Time (min): 10:33 METS: 12.8   Predicted Max HR: 167 bpm % Max HR: 99.4 bpm Rate Pressure Product: 46962  Dose of Adenosine (mg):  n/a Dose of Lexiscan: n/a mg  Dose of Atropine (mg): n/a Dose of Dobutamine: n/a mcg/kg/min (at max HR)  Stress Test Technologist: Esperanza Sheets, CCT Nuclear Technologist: Gonzella Lex, CNMT   Rest Procedure:  Myocardial perfusion imaging was performed at rest 45 minutes following the intravenous administration of Technetium 6m Sestamibi. Stress Procedure:  The patient performed treadmill exercise using a Bruce  Protocol for 10:33 minutes. The patient stopped due to LEG DISCOMFORT AND SOB and denied any chest pain.  There were no significant  ST-T wave changes.  Technetium 5m Sestamibi was injected at peak exercise and myocardial perfusion imaging was performed after a brief delay.  Transient Ischemic Dilatation (Normal <1.22):  0.76 Lung/Heart Ratio (Normal <0.45):  0.37 QGS EDV:  124 ml QGS ESV:  41 ml LV Ejection Fraction: 67%     Rest ECG: NSR - Normal EKG  Stress ECG: No significant change from baseline ECG  QPS Raw Data Images:  Normal; no motion artifact; normal heart/lung ratio. Stress Images:  Normal homogeneous uptake in all areas of the myocardium. Rest Images:  Normal homogeneous uptake in all areas of the myocardium. Subtraction (SDS):  No evidence of ischemia. LV Wall Motion:  NL LV Function; NL Wall Motion  Impression Exercise Capacity:  Good exercise capacity. BP Response:  Hypertensive blood pressure response. Clinical Symptoms:  No significant symptoms noted. ECG Impression:  No significant ST segment change suggestive of ischemia. Comparison with Prior Nuclear Study: No images to compare   Overall Impression:  Normal stress nuclear study. Hypertension at baseline and with exercise.   Thurmon Fair, MD  04/22/2013 12:16 PM

## 2013-04-22 NOTE — Telephone Encounter (Signed)
Pl send order to [facility] to [employer] at time of visit

## 2013-04-25 ENCOUNTER — Encounter: Payer: Self-pay | Admitting: *Deleted

## 2013-04-29 ENCOUNTER — Encounter: Payer: Self-pay | Admitting: Cardiovascular Disease

## 2013-04-29 ENCOUNTER — Ambulatory Visit (INDEPENDENT_AMBULATORY_CARE_PROVIDER_SITE_OTHER): Payer: Managed Care, Other (non HMO) | Admitting: Cardiovascular Disease

## 2013-04-29 VITALS — BP 110/80 | HR 64 | Ht 69.5 in | Wt 252.3 lb

## 2013-04-29 DIAGNOSIS — R079 Chest pain, unspecified: Secondary | ICD-10-CM

## 2013-04-29 DIAGNOSIS — I10 Essential (primary) hypertension: Secondary | ICD-10-CM

## 2013-04-29 DIAGNOSIS — E785 Hyperlipidemia, unspecified: Secondary | ICD-10-CM

## 2013-04-29 NOTE — Patient Instructions (Signed)
Your physician wants you to follow-up in: 3 months with Dr Berry. You will receive a reminder letter in the mail two months in advance. If you don't receive a letter, please call our office to schedule the follow-up appointment.  

## 2013-04-29 NOTE — Assessment & Plan Note (Signed)
Controlled on current medications 

## 2013-04-29 NOTE — Assessment & Plan Note (Signed)
On statin therapy with recent lipid profile performed 03/11/13 revealing a total cholesterol 128, LDL 65 HDL of 40

## 2013-04-29 NOTE — Assessment & Plan Note (Signed)
Raymond Howell had a negative Myoview stress test since I saw him last and has had one episode of atypical chest pain since. I have reassured him that the likelihood that his pain is ischemically mediated is small but not 0. I will see him back in 3 months for followup

## 2013-04-29 NOTE — Progress Notes (Signed)
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 Primary care physician is Dr. Loreen Howell . He was referred because of chest pain. His cardiac risk factor profile is remarkable for family history the father that had his first MI at age 46 and died of a heart attack at age 46. History of hypertension and hyperlipidemia. He does not smoke nor is he diabetic. He developed substernal chest pain with radiation to his left ear occurring 5-6 times . I obtained a Myoview stress test since I last saw him recently that was entirely normal. He said one episode of atypical chest pain since   Current Outpatient Prescriptions  Medication Sig Dispense Refill  . aspirin 81 MG tablet Take 81 mg by mouth daily.        . Flaxseed, Linseed, (FLAXSEED OIL) 1000 MG CAPS Take by mouth 2 (two) times daily.        Marland Kitchen ibuprofen (ADVIL,MOTRIN) 200 MG tablet Take 800 mg by mouth every 6 (six) hours as needed for pain.      . meloxicam (MOBIC) 15 MG tablet Take 1 tablet (15 mg total) by mouth daily.  30 tablet  2  . metaxalone (SKELAXIN) 800 MG tablet Take 800 mg by mouth 4 (four) times daily as needed.      . metoprolol succinate (TOPROL-XL) 100 MG 24 hr tablet Take 1 tablet (100 mg total) by mouth daily.  90 tablet  3  . Multiple Vitamin (MULTIVITAMIN) tablet Take 1 tablet by mouth daily.        . Niacin-Simvastatin (SIMCOR) 1000-40 MG TB24 1 po qhs  90 tablet  3  . NON FORMULARY at bedtime. CPAP       No current facility-administered medications for this visit.    No Known Allergies  History   Social History  . Marital Status: Married    Spouse Name: N/A    Number of Children: N/A  . Years of Education: N/A   Occupational  History  . Not on file.   Social History Main Topics  . Smoking status: Never Smoker   . Smokeless tobacco: Never Used  . Alcohol Use: 1.5 - 2 oz/week    3-4 drink(s) per week  . Drug Use: No  . Sexual Activity: Yes    Partners: Female   Other Topics Concern  . Not on file   Social History Narrative   Reg exercise--- walking the dogs     Review of Systems: General: negative for chills, fever, night sweats or weight changes.  Cardiovascular: negative for chest pain, dyspnea on exertion, edema, orthopnea, palpitations, paroxysmal nocturnal dyspnea or shortness of breath Dermatological: negative for rash Respiratory: negative for cough or wheezing Urologic: negative for hematuria Abdominal: negative for nausea, vomiting, diarrhea, bright red blood per rectum, melena, or hematemesis Neurologic: negative for visual changes, syncope, or dizziness All other systems reviewed and are otherwise negative except as noted above.    Blood pressure 110/80, pulse 64, height 5' 9.5" (1.765 m), weight 252 lb 4.8 oz (114.443 kg).  General appearance: alert and no distress Neck: no adenopathy, no carotid bruit, no JVD, supple, symmetrical, trachea midline and thyroid not enlarged, symmetric, no tenderness/mass/nodules Lungs: clear to auscultation bilaterally Heart:  regular rate and rhythm, S1, S2 normal, no murmur, click, rub or gallop Extremities: extremities normal, atraumatic, no cyanosis or edema  EKG not performed today  ASSESSMENT AND PLAN:   Chest pain Raymond Howell had a negative Myoview stress test since I saw him last and has had one episode of atypical chest pain since. I have reassured him that the likelihood that his pain is ischemically mediated is small but not 0. I will see him back in 3 months for followup  HYPERLIPIDEMIA On statin therapy with recent lipid profile performed 03/11/13 revealing a total cholesterol 128, LDL 65 HDL of 40  HYPERTENSION Controlled on current  medications      Raymond Gess MD [facility], [facility] 04/29/2013 3:43 PM

## 2013-05-01 ENCOUNTER — Encounter: Payer: Self-pay | Admitting: Pulmonary Disease

## 2013-05-02 NOTE — Telephone Encounter (Signed)
I called and spoke with [facility] and she is going to fax this triage fax

## 2013-05-07 NOTE — Telephone Encounter (Signed)
1 m Download 12/16 /14 on 10 cm >> AHI 7/h, good usage, leak ok

## 2013-05-20 ENCOUNTER — Encounter: Payer: Self-pay | Admitting: Pulmonary Disease

## 2013-05-20 ENCOUNTER — Encounter: Payer: Self-pay | Admitting: Internal Medicine

## 2013-05-20 NOTE — Telephone Encounter (Signed)
Dr. Elsworth Soho please advise if you have pt's download in your look at folder you have? thanks

## 2013-05-26 ENCOUNTER — Ambulatory Visit: Payer: Managed Care, Other (non HMO) | Admitting: Pulmonary Disease

## 2013-06-09 ENCOUNTER — Encounter: Payer: Self-pay | Admitting: Internal Medicine

## 2013-06-14 ENCOUNTER — Telehealth: Payer: Self-pay | Admitting: *Deleted

## 2013-06-14 NOTE — Telephone Encounter (Signed)
CPAP 10 cm Mild increase AHI 7/h Does he have any new symptoms?

## 2013-06-14 NOTE — Telephone Encounter (Signed)
lmtcb x1 w/ spouse  Pt has a pending appt on Thursday with RA. Will discuss with him at that time

## 2013-06-16 ENCOUNTER — Ambulatory Visit: Payer: Managed Care, Other (non HMO) | Admitting: Pulmonary Disease

## 2013-06-16 ENCOUNTER — Encounter: Payer: Self-pay | Admitting: Pulmonary Disease

## 2013-06-27 ENCOUNTER — Ambulatory Visit (AMBULATORY_SURGERY_CENTER): Payer: Self-pay | Admitting: *Deleted

## 2013-06-27 VITALS — Ht 69.0 in | Wt 238.2 lb

## 2013-06-27 DIAGNOSIS — Z1211 Encounter for screening for malignant neoplasm of colon: Secondary | ICD-10-CM

## 2013-06-27 MED ORDER — NA SULFATE-K SULFATE-MG SULF 17.5-3.13-1.6 GM/177ML PO SOLN: ORAL | Status: DC

## 2013-06-27 NOTE — Progress Notes (Signed)
Patient denies any allergies to eggs or soy. Patient denies any problems with anesthesia.  

## 2013-06-29 ENCOUNTER — Encounter: Payer: Self-pay | Admitting: Internal Medicine

## 2013-07-12 ENCOUNTER — Encounter: Payer: Self-pay | Admitting: Internal Medicine

## 2013-07-12 ENCOUNTER — Ambulatory Visit (AMBULATORY_SURGERY_CENTER): Payer: Managed Care, Other (non HMO) | Admitting: Internal Medicine

## 2013-07-12 VITALS — BP 105/69 | HR 50 | Temp 97.1°F | Resp 16 | Ht 69.0 in | Wt 238.0 lb

## 2013-07-12 DIAGNOSIS — K5520 Angiodysplasia of colon without hemorrhage: Secondary | ICD-10-CM

## 2013-07-12 DIAGNOSIS — K644 Residual hemorrhoidal skin tags: Secondary | ICD-10-CM

## 2013-07-12 DIAGNOSIS — D126 Benign neoplasm of colon, unspecified: Secondary | ICD-10-CM

## 2013-07-12 DIAGNOSIS — K5730 Diverticulosis of large intestine without perforation or abscess without bleeding: Secondary | ICD-10-CM

## 2013-07-12 DIAGNOSIS — R933 Abnormal findings on diagnostic imaging of other parts of digestive tract: Secondary | ICD-10-CM

## 2013-07-12 DIAGNOSIS — Z1211 Encounter for screening for malignant neoplasm of colon: Secondary | ICD-10-CM

## 2013-07-12 MED ORDER — SODIUM CHLORIDE 0.9 % IV SOLN: 500.0000 mL | INTRAVENOUS | Status: DC

## 2013-07-12 NOTE — Progress Notes (Signed)
Called to room to assist during endoscopic procedure.  Patient ID and intended procedure confirmed with present staff. Received instructions for my participation in the procedure from the performing physician.  

## 2013-07-12 NOTE — Op Note (Signed)
[place]  [address] [place], [address]   COLONOSCOPY PROCEDURE REPORT  PATIENT: Raymond Howell, Raymond Howell  MR#: [id number] BIRTHDATE: 04/07/1961 , 52  yrs. old GENDER: Male ENDOSCOPIST: Gatha Mayer, MD, [facility] PROCEDURE DATE:  07/12/2013 PROCEDURE:   Colonoscopy with biopsy First Screening Colonoscopy - Avg.  risk and is 50 yrs.  old or older Yes.  Prior Negative Screening - Now for repeat screening. N/A  History of Adenoma - Now for follow-up colonoscopy & has been > or = to 3 yrs.  N/A  Polyps Removed Today? No.  Recommend repeat exam, <10 yrs? No. ASA CLASS:   Class II INDICATIONS:average risk screening. MEDICATIONS: propofol (Diprivan) 300mg  IV, MAC sedation, administered by CRNA, and These medications were titrated to patient response per physician's verbal order  DESCRIPTION OF PROCEDURE:   After the risks benefits and alternatives of the procedure were thoroughly explained, informed consent was obtained.  A digital rectal exam revealed no abnormalities of the rectum, A digital rectal exam revealed no prostatic nodules, and A digital rectal exam revealed the prostate was not enlarged.   The LB [id number]  endoscope was introduced through the anus and advanced to the cecum, which was identified by both the appendix and ileocecal valve. No adverse events experienced.   The quality of the prep was excellent using Suprep  The instrument was then slowly withdrawn as the colon was fully examined.   COLON FINDINGS: A few small patches of abnormal mucosa were found in the descending colon.  The mucosa had loss of vascularity and slightly raised white discoloration.   Multiple biopsies were performed using cold forceps.   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   Mild diverticulosis was noted in the sigmoid colon.   The colon mucosa was otherwise normal.   A right colon retroflexion was performed.  Retroflexed views revealed external hemorrhoids.  The time to cecum=3 minutes 30 seconds.  Withdrawal time=8 minutes 54 seconds.  The scope was withdrawn and the procedure completed. COMPLICATIONS: There were no complications.  ENDOSCOPIC IMPRESSION: 1.   Some small patched of abnormal mucosa were found in the descending colon; multiple biopsies were performed using cold forceps 2.   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   Mild diverticulosis was noted in the sigmoid colon 4.   The colon mucosa was otherwise normal - excellent prep  RECOMMENDATIONS: Await pathology results   eSigned:  Gatha Mayer, MD, [facility] 07/12/2013 11:01 AM   cc: The Patient

## 2013-07-12 NOTE — Patient Instructions (Addendum)
There was an area of abnormal mucosa (lining) in the colon. Probably not a significant issue but I took biopsies to better understand it.  You still have the tiny AVM (blood vessels on lining), diverticulosis and some hemorrhoids.  I will let you know pathology results and plans.  I appreciate the opportunity to care for you. Gatha Mayer, MD, FACG   YOU HAD AN ENDOSCOPIC PROCEDURE TODAY AT [facility]: Refer to the procedure report that was given to you for any specific questions about what was found during the examination.  If the procedure report does not answer your questions, please call your gastroenterologist to clarify.  If you requested that your care partner not be given the details of your procedure findings, then the procedure report has been included in a sealed envelope for you to review at your convenience later.  YOU SHOULD EXPECT: Some feelings of bloating in the abdomen. Passage of more gas than usual.  Walking can help get rid of the air that was put into your GI tract during the procedure and reduce the bloating. If you had a lower endoscopy (such as a colonoscopy or flexible sigmoidoscopy) you may notice spotting of blood in your stool or on the toilet paper. If you underwent a bowel prep for your procedure, then you may not have a normal bowel movement for a few days.  DIET: Your first meal following the procedure should be a light meal and then it is ok to progress to your normal diet.  A half-sandwich or bowl of soup is an example of a good first meal.  Heavy or fried foods are harder to digest and may make you feel nauseous or bloated.  Likewise meals heavy in dairy and vegetables can cause extra gas to form and this can also increase the bloating.  Drink plenty of fluids but you should avoid alcoholic beverages for 24 hours.  ACTIVITY: Your care partner should take you home directly after the procedure.  You should plan to take it easy, moving slowly for  the rest of the day.  You can resume normal activity the day after the procedure however you should NOT DRIVE or use heavy machinery for 24 hours (because of the sedation medicines used during the test).    SYMPTOMS TO REPORT IMMEDIATELY: A gastroenterologist can be reached at any hour.  During normal business hours, 8:30 AM to 5:00 PM Monday through Friday, call [phone].  After hours and on weekends, please call the GI answering service at [phone] who will take a message and have the physician on call contact you.   Following lower endoscopy (colonoscopy or flexible sigmoidoscopy):  Excessive amounts of blood in the stool  Significant tenderness or worsening of abdominal pains  Swelling of the abdomen that is new, acute  Fever of 100F or higher  FOLLOW UP: If any biopsies were taken you will be contacted by phone or by letter within the next 1-3 weeks.  Call your gastroenterologist if you have not heard about the biopsies in 3 weeks.  Our staff will call the home number listed on your records the next business day following your procedure to check on you and address any questions or concerns that you may have at that time regarding the information given to you following your procedure. This is a courtesy call and so if there is no answer at the home number and we have not heard from you through the emergency physician on  call, we will assume that you have returned to your regular daily activities without incident.  SIGNATURES/CONFIDENTIALITY: You and/or your care partner have signed paperwork which will be entered into your electronic medical record.  These signatures attest to the fact that that the information above on your After Visit Summary has been reviewed and is understood.  Full responsibility of the confidentiality of this discharge information lies with you and/or your care-partner.

## 2013-07-13 ENCOUNTER — Telehealth: Payer: Self-pay | Admitting: *Deleted

## 2013-07-13 NOTE — Telephone Encounter (Signed)
  Follow up Call-  Call back number 07/12/2013  Post procedure Call Back phone  # [phone]  Permission to leave phone message Yes     Patient questions:  Do you have a fever, pain , or abdominal swelling? no Pain Score  0 *  Have you tolerated food without any problems? yes  Have you been able to return to your normal activities? yes  Do you have any questions about your discharge instructions: Diet   no Medications  no Follow up visit  no  Do you have questions or concerns about your Care? no  Actions: * If pain score is 4 or above: No action needed, pain <4.

## 2013-07-14 ENCOUNTER — Ambulatory Visit (INDEPENDENT_AMBULATORY_CARE_PROVIDER_SITE_OTHER): Payer: Managed Care, Other (non HMO) | Admitting: Pulmonary Disease

## 2013-07-14 ENCOUNTER — Encounter: Payer: Self-pay | Admitting: Pulmonary Disease

## 2013-07-14 VITALS — BP 122/78 | HR 53 | Ht 70.0 in | Wt 237.0 lb

## 2013-07-14 DIAGNOSIS — G4733 Obstructive sleep apnea (adult) (pediatric): Secondary | ICD-10-CM

## 2013-07-14 DIAGNOSIS — G4730 Sleep apnea, unspecified: Secondary | ICD-10-CM

## 2013-07-14 NOTE — Progress Notes (Signed)
   Subjective:    Patient ID: Raymond Howell, male    DOB: 06-09-1960, 53 y.o.   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  He had a sleep evaluation 2004 (205 lbs )- AHI was 12/ , did not need treatment. He has gained 35 lbs since then & his wife has noted loud snoring & witnessed apneas.  12/11 PSG (240 lbs ) >> AHI 22/h , lowest desatn 87%, PLMs disappeared with PAP. Central apneas emerged at 11 cm & persisted on BiPAP 13/9.  Tolerated CPAP 9 cm well, full face mask -  download 04/19/10 - 01/07/11 >> few residual hypopneas on 10 cm. Note that he had centrals during titration on 11 cm  Given that he was symptomatically better , did not increase pressure.   07/14/2013 Lost 20 lbs in 1 month on nutri system pressure ok, mask ok, no dryness,Uses humidity only in winter.   Good compliance per report. Able to drive long distances without sleepiness.  Download 04/2013 -CPAP 10 cm  Mild increase AHI 7/h Mild snoring per wife   Review of Systems neg for any significant sore throat, dysphagia, itching, sneezing, nasal congestion or excess/ purulent secretions, fever, chills, sweats, unintended wt loss, pleuritic or exertional cp, hempoptysis, orthopnea pnd or change in chronic leg swelling. Also denies presyncope, palpitations, heartburn, abdominal pain, nausea, vomiting, diarrhea or change in bowel or urinary habits, dysuria,hematuria, rash, arthralgias, visual complaints, headache, numbness weakness or ataxia.     Objective:   Physical Exam  Gen. Pleasant, obese, in no distress ENT - no lesions, no post nasal drip Neck: No JVD, no thyromegaly, no carotid bruits Lungs: no use of accessory muscles, no dullness to percussion, decreased without rales or rhonchi  Cardiovascular: Rhythm regular, heart sounds  normal, no murmurs or gallops, no peripheral edema Musculoskeletal: No deformities, no cyanosis or clubbing , no tremors       Assessment & Plan:

## 2013-07-14 NOTE — Assessment & Plan Note (Signed)
Download in 51m onth , If residual AHI > 5 or if snoring reports, will increase pressure to 11 cm Congratulations on weight loss, if falls to 200 lbs, rpt study  Weight loss encouraged, compliance with goal of at least 4-6 hrs every night is the expectation. Advised against medications with sedative side effects Cautioned against driving when sleepy - understanding that sleepiness will vary on a day to day basis

## 2013-07-14 NOTE — Patient Instructions (Signed)
Download in 64m onth & review need to increase pressure Congratulations on weight loss

## 2013-07-15 ENCOUNTER — Encounter: Payer: Self-pay | Admitting: Family Medicine

## 2013-07-19 ENCOUNTER — Encounter: Payer: Self-pay | Admitting: Internal Medicine

## 2013-07-19 NOTE — Progress Notes (Signed)
Quick Note:  Benign polypoid mucosal changes Repeat colonoscopy 2025 ______

## 2013-08-01 ENCOUNTER — Encounter: Payer: Self-pay | Admitting: Family Medicine

## 2013-08-02 ENCOUNTER — Ambulatory Visit (INDEPENDENT_AMBULATORY_CARE_PROVIDER_SITE_OTHER): Payer: Managed Care, Other (non HMO) | Admitting: Cardiovascular Disease

## 2013-08-02 ENCOUNTER — Encounter: Payer: Self-pay | Admitting: Cardiovascular Disease

## 2013-08-02 VITALS — BP 118/84 | HR 51 | Ht 70.0 in | Wt 233.9 lb

## 2013-08-02 DIAGNOSIS — I10 Essential (primary) hypertension: Secondary | ICD-10-CM

## 2013-08-02 DIAGNOSIS — E785 Hyperlipidemia, unspecified: Secondary | ICD-10-CM

## 2013-08-02 DIAGNOSIS — R079 Chest pain, unspecified: Secondary | ICD-10-CM

## 2013-08-02 NOTE — Assessment & Plan Note (Signed)
On statin drugs with recent lipid profile performed 03/11/13 revealing a total cholesterol 120, LDL 65 HDL of 40

## 2013-08-02 NOTE — Assessment & Plan Note (Signed)
Well-controlled on current medications 

## 2013-08-02 NOTE — Progress Notes (Signed)
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 Primary care physician is Dr. Garnet Howell . He was referred because of chest pain. His cardiac risk factor profile is remarkable for family history the father that had his first MI at age 29 and died of a heart attack at age 24. History of hypertension and hyperlipidemia. He does not smoke nor is he diabetic. He developed substernal chest pain with radiation to his left ear occurring 5-6 times . I obtained a Myoview stress test sincesince I saw him 3 months ago he's had no episodes of chest pain. He has lost 20-30 pounds through diet and exercise. He is to limit her caffeine from his diet.   Current Outpatient Prescriptions  Medication Sig Dispense Refill  . aspirin 81 MG tablet Take 81 mg by mouth daily.        . Flaxseed, Linseed, (FLAXSEED OIL) 1000 MG CAPS Take by mouth 2 (two) times daily.        Marland Kitchen ibuprofen (ADVIL,MOTRIN) 200 MG tablet Take 800 mg by mouth every 6 (six) hours as needed for pain.      . meloxicam (MOBIC) 15 MG tablet Take 15 mg by mouth daily as needed.      . metaxalone (SKELAXIN) 800 MG tablet Take 800 mg by mouth 4 (four) times daily as needed.      . metoprolol succinate (TOPROL-XL) 100 MG 24 hr tablet Take 1 tablet (100 mg total) by mouth daily.  90 tablet  3  . Multiple Vitamin (MULTIVITAMIN) tablet Take 1 tablet by mouth daily.        . Niacin-Simvastatin (SIMCOR) 1000-40 MG TB24 1 po qhs  90 tablet  3  . NON FORMULARY at bedtime. CPAP       No current facility-administered medications for this visit.    No Known Allergies  History   Social History  . Marital Status: Married    Spouse Name: N/A    Number of Children: N/A  .  Years of Education: N/A   Occupational History  . Not on file.   Social History Main Topics  . Smoking status: Never Smoker   . Smokeless tobacco: Never Used  . Alcohol Use: 1.5 - 2 oz/week    3-4 drink(s) per week     Comment: per week  . Drug Use: No  . Sexual Activity: Yes    Partners: Female   Other Topics Concern  . Not on file   Social History Narrative   Reg exercise--- walking the dogs     Review of Systems: General: negative for chills, fever, night sweats or weight changes.  Cardiovascular: negative for chest pain, dyspnea on exertion, edema, orthopnea, palpitations, paroxysmal nocturnal dyspnea or shortness of breath Dermatological: negative for rash Respiratory: negative for cough or wheezing Urologic: negative for hematuria Abdominal: negative for nausea, vomiting, diarrhea, bright red blood per rectum, melena, or hematemesis Neurologic: negative for visual changes, syncope, or dizziness All other systems reviewed and are otherwise negative except as noted above.    Blood pressure 118/84, pulse 51, height 5\' 10"  (1.778 m), weight 233 lb 14.4 oz (106.096 kg).  General appearance: alert and no distress Neck: no adenopathy, no carotid bruit, no  JVD, supple, symmetrical, trachea midline and thyroid not enlarged, symmetric, no tenderness/mass/nodules Lungs: clear to auscultation bilaterally Heart: regular rate and rhythm, S1, S2 normal, no murmur, click, rub or gallop Extremities: extremities normal, atraumatic, no cyanosis or edema  EKG sinus bradycardia at 51 without ST or T wave changes  ASSESSMENT AND PLAN:   Chest pain Patient has had no episodes of chest pain since I saw him 3 months ago. He had a negative Myoview stress test. We will continue to follow him conservatively.  HYPERTENSION Well-controlled on current medications  HYPERLIPIDEMIA On statin drugs with recent lipid profile performed 03/11/13 revealing a total cholesterol 120, LDL 65 HDL of  40      Raymond Harp MD [facility], [facility] 08/02/2013 8:27 AM

## 2013-08-02 NOTE — Assessment & Plan Note (Signed)
Patient has had no episodes of chest pain since I saw him 3 months ago. He had a negative Myoview stress test. We will continue to follow him conservatively.

## 2013-08-02 NOTE — Patient Instructions (Signed)
Your physician recommends that you schedule a follow-up appointment in: 1 year  

## 2013-08-04 MED ORDER — MELOXICAM 15 MG PO TABS: 15.0000 mg | ORAL_TABLET | Freq: Every day | ORAL | Status: DC

## 2013-08-04 NOTE — Telephone Encounter (Signed)
Med filled.  

## 2013-08-18 ENCOUNTER — Telehealth: Payer: Self-pay | Admitting: Pulmonary Disease

## 2013-08-18 NOTE — Telephone Encounter (Signed)
Pt advised. Tyneshia Stivers, CMA  

## 2013-08-18 NOTE — Telephone Encounter (Signed)
33m download 08/2013 - on 10 cm -good usage, no leak,no changes

## 2013-08-18 NOTE — Telephone Encounter (Signed)
lmomtcb x1 for pt on home and mobile #

## 2013-09-05 ENCOUNTER — Ambulatory Visit (INDEPENDENT_AMBULATORY_CARE_PROVIDER_SITE_OTHER): Payer: Managed Care, Other (non HMO) | Admitting: Family Medicine

## 2013-09-05 ENCOUNTER — Encounter: Payer: Self-pay | Admitting: Family Medicine

## 2013-09-05 VITALS — BP 124/82 | HR 55 | Temp 98.2°F | Wt 229.0 lb

## 2013-09-05 DIAGNOSIS — R7309 Other abnormal glucose: Secondary | ICD-10-CM

## 2013-09-05 DIAGNOSIS — I10 Essential (primary) hypertension: Secondary | ICD-10-CM

## 2013-09-05 DIAGNOSIS — E785 Hyperlipidemia, unspecified: Secondary | ICD-10-CM

## 2013-09-05 DIAGNOSIS — R739 Hyperglycemia, unspecified: Secondary | ICD-10-CM

## 2013-09-05 LAB — BASIC METABOLIC PANEL
BUN: 14 mg/dL (ref 6–23)
CALCIUM: 9.5 mg/dL (ref 8.4–10.5)
CO2: 28 meq/L (ref 19–32)
CREATININE: 0.7 mg/dL (ref 0.4–1.5)
Chloride: 103 mEq/L (ref 96–112)
GFR: 117.2 mL/min (ref >60.00)
Glucose, Bld: 117 mg/dL — ABNORMAL HIGH (ref 70–99)
Potassium: 4.2 mEq/L (ref 3.5–5.1)
Sodium: 140 mEq/L (ref 135–145)

## 2013-09-05 LAB — HEPATIC FUNCTION PANEL
ALK PHOS: 50 U/L (ref 39–117)
ALT: 32 U/L (ref 0–53)
AST: 32 U/L (ref 0–37)
Albumin: 4.4 g/dL (ref 3.5–5.2)
BILIRUBIN DIRECT: 0.1 mg/dL (ref 0.0–0.3)
Total Bilirubin: 0.8 mg/dL (ref 0.3–1.2)
Total Protein: 7.2 g/dL (ref 6.0–8.3)

## 2013-09-05 LAB — CBC WITH DIFFERENTIAL/PLATELET
BASOS ABS: 0 10*3/uL (ref 0.0–0.1)
BASOS PCT: 0.4 % (ref 0.0–3.0)
Eosinophils Absolute: 0.2 10*3/uL (ref 0.0–0.7)
Eosinophils Relative: 3.2 % (ref 0.0–5.0)
HCT: 43.7 % (ref 39.0–52.0)
Hemoglobin: 15.2 g/dL (ref 13.0–17.0)
LYMPHS PCT: 26.1 % (ref 12.0–46.0)
Lymphs Abs: 1.6 10*3/uL (ref 0.7–4.0)
MCHC: 34.8 g/dL (ref 30.0–36.0)
MCV: 94.2 fl (ref 78.0–100.0)
Monocytes Absolute: 0.6 10*3/uL (ref 0.1–1.0)
Monocytes Relative: 8.9 % (ref 3.0–12.0)
NEUTROS PCT: 61.4 % (ref 43.0–77.0)
Neutro Abs: 3.8 10*3/uL (ref 1.4–7.7)
Platelets: 202 10*3/uL (ref 150.0–400.0)
RBC: 4.63 Mil/uL (ref 4.22–5.81)
RDW: 12.2 % (ref 11.5–14.6)
WBC: 6.2 10*3/uL (ref 4.5–10.5)

## 2013-09-05 LAB — LIPID PANEL
CHOL/HDL RATIO: 3
Cholesterol: 106 mg/dL (ref 0–200)
HDL: 34.6 mg/dL — ABNORMAL LOW (ref >39.00)
LDL Cholesterol: 47 mg/dL (ref 0–99)
Triglycerides: 124 mg/dL (ref 0.0–149.0)
VLDL: 24.8 mg/dL (ref 0.0–40.0)

## 2013-09-05 NOTE — Patient Instructions (Signed)

## 2013-09-05 NOTE — Assessment & Plan Note (Signed)
Check labs Cont' meds 

## 2013-09-05 NOTE — Progress Notes (Signed)
  Subjective:    Patient here for follow-up of elevated blood pressure.  He is exercising and is adherent to a low-salt diet.  Blood pressure is well controlled at home. Cardiac symptoms: none. Patient denies: chest pain, chest pressure/discomfort, claudication, dyspnea, exertional chest pressure/discomfort, fatigue, irregular heart beat, lower extremity edema, near-syncope, orthopnea, palpitations, paroxysmal nocturnal dyspnea, syncope and tachypnea. Cardiovascular risk factors: dyslipidemia, hypertension and male gender. Use of agents associated with hypertension: none. History of target organ damage: none.  The following portions of the patient's history were reviewed and updated as appropriate: allergies, current medications, past family history, past medical history, past social history, past surgical history and problem list.  Review of Systems Pertinent items are noted in HPI.     Objective:    BP 124/82  Pulse 55  Temp(Src) 98.2 F (36.8 C) (Oral)  Wt 229 lb (103.874 kg)  SpO2 99% General appearance: alert, cooperative, appears stated age and no distress Neck: no adenopathy, supple, symmetrical, trachea midline and thyroid not enlarged, symmetric, no tenderness/mass/nodules Lungs: clear to auscultation bilaterally Heart: S1, S2 normal Extremities: extremities normal, atraumatic, no cyanosis or edema    Assessment:    Hypertension, normal blood pressure . Evidence of target organ damage: none.    Plan:    Medication: no change. Dietary sodium restriction. Regular aerobic exercise. Check blood pressures 2-3 times weekly and record. Follow up: 6 months and as needed.

## 2013-09-05 NOTE — Progress Notes (Signed)
Pre visit review using our clinic review tool, if applicable. No additional management support is needed unless otherwise documented below in the visit note. 

## 2013-09-05 NOTE — Assessment & Plan Note (Signed)
Check labs con't with diet-- pt on nutrisystem and doing well

## 2013-09-06 ENCOUNTER — Telehealth: Payer: Self-pay | Admitting: Family Medicine

## 2013-09-06 NOTE — Telephone Encounter (Signed)
Relevant patient education assigned to patient using Emmi. ° °

## 2013-11-23 ENCOUNTER — Telehealth: Payer: Self-pay | Admitting: Family Medicine

## 2013-11-23 DIAGNOSIS — R7989 Other specified abnormal findings of blood chemistry: Secondary | ICD-10-CM

## 2013-11-23 DIAGNOSIS — E785 Hyperlipidemia, unspecified: Secondary | ICD-10-CM

## 2013-11-23 NOTE — Telephone Encounter (Signed)
Orders in, Please schedule      KP

## 2013-11-23 NOTE — Telephone Encounter (Signed)
Patient called stating that it was time to schedule a 3 month lab appt. Please advise.

## 2013-11-25 ENCOUNTER — Other Ambulatory Visit (INDEPENDENT_AMBULATORY_CARE_PROVIDER_SITE_OTHER): Payer: Managed Care, Other (non HMO)

## 2013-11-25 DIAGNOSIS — E785 Hyperlipidemia, unspecified: Secondary | ICD-10-CM

## 2013-11-25 DIAGNOSIS — R7989 Other specified abnormal findings of blood chemistry: Secondary | ICD-10-CM

## 2013-11-25 LAB — HEPATIC FUNCTION PANEL
ALT: 30 U/L (ref 0–53)
AST: 30 U/L (ref 0–37)
Albumin: 4.3 g/dL (ref 3.5–5.2)
Alkaline Phosphatase: 42 U/L (ref 39–117)
BILIRUBIN DIRECT: 0.2 mg/dL (ref 0.0–0.3)
BILIRUBIN TOTAL: 1.6 mg/dL — AB (ref 0.2–1.2)
Total Protein: 6.7 g/dL (ref 6.0–8.3)

## 2013-11-25 LAB — LIPID PANEL
CHOLESTEROL: 116 mg/dL (ref 0–200)
HDL: 44.6 mg/dL (ref >39.00)
LDL Cholesterol: 53 mg/dL (ref 0–99)
NonHDL: 71.4
Total CHOL/HDL Ratio: 3
Triglycerides: 94 mg/dL (ref 0.0–149.0)
VLDL: 18.8 mg/dL (ref 0.0–40.0)

## 2013-11-25 LAB — BASIC METABOLIC PANEL
BUN: 19 mg/dL (ref 6–23)
CALCIUM: 9.3 mg/dL (ref 8.4–10.5)
CO2: 30 mEq/L (ref 19–32)
Chloride: 105 mEq/L (ref 96–112)
Creatinine, Ser: 0.8 mg/dL (ref 0.4–1.5)
GFR: 111.86 mL/min (ref >60.00)
GLUCOSE: 110 mg/dL — AB (ref 70–99)
Potassium: 4 mEq/L (ref 3.5–5.1)
Sodium: 140 mEq/L (ref 135–145)

## 2013-11-25 LAB — HEMOGLOBIN A1C: Hgb A1c MFr Bld: 4.8 % (ref 4.6–6.5)

## 2013-11-29 ENCOUNTER — Telehealth: Payer: Self-pay

## 2013-11-29 ENCOUNTER — Encounter: Payer: Self-pay | Admitting: Family Medicine

## 2013-11-29 MED ORDER — MELOXICAM 15 MG PO TABS: 15.0000 mg | ORAL_TABLET | Freq: Every day | ORAL | Status: DC

## 2013-11-29 NOTE — Telephone Encounter (Signed)
Opened in error

## 2013-11-29 NOTE — Telephone Encounter (Signed)
Pt sent a MyChart message stating:  Can you please call in another 90 day supply of the Meloxicam? I have approx 10 pills left and my back has been acting up lately. We will need to discuss during my annual physical in Sept. Please advise when it has been called in. Thank you.  Last OV: 09/05/13 Next appt: 03/09/14  Medication filled per request.  Left a detailed message making patient aware that prescription has been filled and that physical appointment is scheduled for 03/09/14 instead of September.

## 2014-03-09 ENCOUNTER — Ambulatory Visit (INDEPENDENT_AMBULATORY_CARE_PROVIDER_SITE_OTHER): Payer: Managed Care, Other (non HMO) | Admitting: Family Medicine

## 2014-03-09 ENCOUNTER — Encounter: Payer: Self-pay | Admitting: Family Medicine

## 2014-03-09 VITALS — BP 120/73 | HR 58 | Temp 98.5°F | Ht 70.0 in | Wt 229.8 lb

## 2014-03-09 DIAGNOSIS — Z0000 Encounter for general adult medical examination without abnormal findings: Secondary | ICD-10-CM

## 2014-03-09 DIAGNOSIS — Z23 Encounter for immunization: Secondary | ICD-10-CM

## 2014-03-09 DIAGNOSIS — I10 Essential (primary) hypertension: Secondary | ICD-10-CM

## 2014-03-09 DIAGNOSIS — E785 Hyperlipidemia, unspecified: Secondary | ICD-10-CM

## 2014-03-09 LAB — LIPID PANEL
CHOL/HDL RATIO: 3
Cholesterol: 125 mg/dL (ref 0–200)
HDL: 38.3 mg/dL — ABNORMAL LOW (ref >39.00)
LDL CALC: 56 mg/dL (ref 0–99)
NonHDL: 86.7
Triglycerides: 152 mg/dL — ABNORMAL HIGH (ref 0.0–149.0)
VLDL: 30.4 mg/dL (ref 0.0–40.0)

## 2014-03-09 LAB — CBC WITH DIFFERENTIAL/PLATELET
BASOS ABS: 0 10*3/uL (ref 0.0–0.1)
Basophils Relative: 0.2 % (ref 0.0–3.0)
EOS PCT: 1.6 % (ref 0.0–5.0)
Eosinophils Absolute: 0.2 10*3/uL (ref 0.0–0.7)
HCT: 44.9 % (ref 39.0–52.0)
Hemoglobin: 15.4 g/dL (ref 13.0–17.0)
Lymphocytes Relative: 15.8 % (ref 12.0–46.0)
Lymphs Abs: 1.7 10*3/uL (ref 0.7–4.0)
MCHC: 34.3 g/dL (ref 30.0–36.0)
MCV: 92.9 fl (ref 78.0–100.0)
MONO ABS: 0.6 10*3/uL (ref 0.1–1.0)
Monocytes Relative: 5.3 % (ref 3.0–12.0)
Neutro Abs: 8.4 10*3/uL — ABNORMAL HIGH (ref 1.4–7.7)
Neutrophils Relative %: 77.1 % — ABNORMAL HIGH (ref 43.0–77.0)
Platelets: 231 10*3/uL (ref 150.0–400.0)
RBC: 4.83 Mil/uL (ref 4.22–5.81)
RDW: 12.4 % (ref 11.5–15.5)
WBC: 10.9 10*3/uL — AB (ref 4.0–10.5)

## 2014-03-09 LAB — MICROALBUMIN / CREATININE URINE RATIO
Creatinine,U: 113 mg/dL
Microalb Creat Ratio: 1.1 mg/g (ref 0.0–30.0)
Microalb, Ur: 1.2 mg/dL (ref 0.0–1.9)

## 2014-03-09 LAB — BASIC METABOLIC PANEL WITH GFR
BUN: 16 mg/dL (ref 6–23)
CO2: 21 meq/L (ref 19–32)
Calcium: 9.5 mg/dL (ref 8.4–10.5)
Chloride: 103 meq/L (ref 96–112)
Creatinine, Ser: 0.8 mg/dL (ref 0.4–1.5)
GFR: 103.91 mL/min (ref >60.00)
Glucose, Bld: 108 mg/dL — ABNORMAL HIGH (ref 70–99)
Potassium: 4.3 meq/L (ref 3.5–5.1)
Sodium: 138 meq/L (ref 135–145)

## 2014-03-09 LAB — HEPATIC FUNCTION PANEL
ALT: 32 U/L (ref 0–53)
AST: 27 U/L (ref 0–37)
Albumin: 3.9 g/dL (ref 3.5–5.2)
Alkaline Phosphatase: 57 U/L (ref 39–117)
BILIRUBIN TOTAL: 1.3 mg/dL — AB (ref 0.2–1.2)
Bilirubin, Direct: 0.2 mg/dL (ref 0.0–0.3)
Total Protein: 7.1 g/dL (ref 6.0–8.3)

## 2014-03-09 LAB — PSA: PSA: 13.23 ng/mL — ABNORMAL HIGH (ref 0.10–4.00)

## 2014-03-09 LAB — TSH: TSH: 1.61 u[IU]/mL (ref 0.35–4.50)

## 2014-03-09 MED ORDER — NIACIN-SIMVASTATIN ER 1000-40 MG PO TB24: ORAL_TABLET | ORAL | Status: DC

## 2014-03-09 MED ORDER — METOPROLOL SUCCINATE ER 100 MG PO TB24: 100.0000 mg | ORAL_TABLET | Freq: Every day | ORAL | Status: DC

## 2014-03-09 NOTE — Patient Instructions (Signed)
Preventive Care for Adults A healthy lifestyle and preventive care can promote health and wellness. Preventive health guidelines for men include the following key practices:  A routine yearly physical is a good way to check with your health care provider about your health and preventative screening. It is a chance to share any concerns and updates on your health and to receive a thorough exam.  Visit your dentist for a routine exam and preventative care every 6 months. Brush your teeth twice a day and floss once a day. Good oral hygiene prevents tooth decay and gum disease.  The frequency of eye exams is based on your age, health, family medical history, use of contact lenses, and other factors. Follow your health care provider's recommendations for frequency of eye exams.  Eat a healthy diet. Foods such as vegetables, fruits, whole grains, low-fat dairy products, and lean protein foods contain the nutrients you need without too many calories. Decrease your intake of foods high in solid fats, added sugars, and salt. Eat the right amount of calories for you.Get information about a proper diet from your health care provider, if necessary.  Regular physical exercise is one of the most important things you can do for your health. Most adults should get at least 150 minutes of moderate-intensity exercise (any activity that increases your heart rate and causes you to sweat) each week. In addition, most adults need muscle-strengthening exercises on 2 or more days a week.  Maintain a healthy weight. The body mass index (BMI) is a screening tool to identify possible weight problems. It provides an estimate of body fat based on height and weight. Your health care provider can find your BMI and can help you achieve or maintain a healthy weight.For adults 20 years and older:  A BMI below 18.5 is considered underweight.  A BMI of 18.5 to 24.9 is normal.  A BMI of 25 to 29.9 is considered overweight.  A BMI  of 30 and above is considered obese.  Maintain normal blood lipids and cholesterol levels by exercising and minimizing your intake of saturated fat. Eat a balanced diet with plenty of fruit and vegetables. Blood tests for lipids and cholesterol should begin at age 50 and be repeated every 5 years. If your lipid or cholesterol levels are high, you are over 50, or you are at high risk for heart disease, you may need your cholesterol levels checked more frequently.Ongoing high lipid and cholesterol levels should be treated with medicines if diet and exercise are not working.  If you smoke, find out from your health care provider how to quit. If you do not use tobacco, do not start.  Lung cancer screening is recommended for adults aged 73-80 years who are at high risk for developing lung cancer because of a history of smoking. A yearly low-dose CT scan of the lungs is recommended for people who have at least a 30-pack-year history of smoking and are a current smoker or have quit within the past 15 years. A pack year of smoking is smoking an average of 1 pack of cigarettes a day for 1 year (for example: 1 pack a day for 30 years or 2 packs a day for 15 years). Yearly screening should continue until the smoker has stopped smoking for at least 15 years. Yearly screening should be stopped for people who develop a health problem that would prevent them from having lung cancer treatment.  If you choose to drink alcohol, do not have more than  2 drinks per day. One drink is considered to be 12 ounces (355 mL) of beer, 5 ounces (148 mL) of wine, or 1.5 ounces (44 mL) of liquor.  Avoid use of street drugs. Do not share needles with anyone. Ask for help if you need support or instructions about stopping the use of drugs.  High blood pressure causes heart disease and increases the risk of stroke. Your blood pressure should be checked at least every 1-2 years. Ongoing high blood pressure should be treated with  medicines, if weight loss and exercise are not effective.  If you are 45-79 years old, ask your health care provider if you should take aspirin to prevent heart disease.  Diabetes screening involves taking a blood sample to check your fasting blood sugar level. This should be done once every 3 years, after age 45, if you are within normal weight and without risk factors for diabetes. Testing should be considered at a younger age or be carried out more frequently if you are overweight and have at least 1 risk factor for diabetes.  Colorectal cancer can be detected and often prevented. Most routine colorectal cancer screening begins at the age of 50 and continues through age 75. However, your health care provider may recommend screening at an earlier age if you have risk factors for colon cancer. On a yearly basis, your health care provider may provide home test kits to check for hidden blood in the stool. Use of a small camera at the end of a tube to directly examine the colon (sigmoidoscopy or colonoscopy) can detect the earliest forms of colorectal cancer. Talk to your health care provider about this at age 50, when routine screening begins. Direct exam of the colon should be repeated every 5-10 years through age 75, unless early forms of precancerous polyps or small growths are found.  People who are at an increased risk for hepatitis B should be screened for this virus. You are considered at high risk for hepatitis B if:  You were born in a country where hepatitis B occurs often. Talk with your health care provider about which countries are considered high risk.  Your parents were born in a high-risk country and you have not received a shot to protect against hepatitis B (hepatitis B vaccine).  You have HIV or AIDS.  You use needles to inject street drugs.  You live with, or have sex with, someone who has hepatitis B.  You are a man who has sex with other men (MSM).  You get hemodialysis  treatment.  You take certain medicines for conditions such as cancer, organ transplantation, and autoimmune conditions.  Hepatitis C blood testing is recommended for all people born from 1945 through 1965 and any individual with known risks for hepatitis C.  Practice safe sex. Use condoms and avoid high-risk sexual practices to reduce the spread of sexually transmitted infections (STIs). STIs include gonorrhea, chlamydia, syphilis, trichomonas, herpes, HPV, and human immunodeficiency virus (HIV). Herpes, HIV, and HPV are viral illnesses that have no cure. They can result in disability, cancer, and death.  If you are at risk of being infected with HIV, it is recommended that you take a prescription medicine daily to prevent HIV infection. This is called preexposure prophylaxis (PrEP). You are considered at risk if:  You are a man who has sex with other men (MSM) and have other risk factors.  You are a heterosexual man, are sexually active, and are at increased risk for HIV infection.    You take drugs by injection.  You are sexually active with a partner who has HIV.  Talk with your health care provider about whether you are at high risk of being infected with HIV. If you choose to begin PrEP, you should first be tested for HIV. You should then be tested every 3 months for as long as you are taking PrEP.  A one-time screening for abdominal aortic aneurysm (AAA) and surgical repair of large AAAs by ultrasound are recommended for men ages 32 to 67 years who are current or former smokers.  Healthy men should no longer receive prostate-specific antigen (PSA) blood tests as part of routine cancer screening. Talk with your health care provider about prostate cancer screening.  Testicular cancer screening is not recommended for adult males who have no symptoms. Screening includes self-exam, a health care provider exam, and other screening tests. Consult with your health care provider about any symptoms  you have or any concerns you have about testicular cancer.  Use sunscreen. Apply sunscreen liberally and repeatedly throughout the day. You should seek shade when your shadow is shorter than you. Protect yourself by wearing long sleeves, pants, a wide-brimmed hat, and sunglasses year round, whenever you are outdoors.  Once a month, do a whole-body skin exam, using a mirror to look at the skin on your back. Tell your health care provider about new moles, moles that have irregular borders, moles that are larger than a pencil eraser, or moles that have changed in shape or color.  Stay current with required vaccines (immunizations).  Influenza vaccine. All adults should be immunized every year.  Tetanus, diphtheria, and acellular pertussis (Td, Tdap) vaccine. An adult who has not previously received Tdap or who does not know his vaccine status should receive 1 dose of Tdap. This initial dose should be followed by tetanus and diphtheria toxoids (Td) booster doses every 10 years. Adults with an unknown or incomplete history of completing a 3-dose immunization series with Td-containing vaccines should begin or complete a primary immunization series including a Tdap dose. Adults should receive a Td booster every 10 years.  Varicella vaccine. An adult without evidence of immunity to varicella should receive 2 doses or a second dose if he has previously received 1 dose.  Human papillomavirus (HPV) vaccine. Males aged 68-21 years who have not received the vaccine previously should receive the 3-dose series. Males aged 22-26 years may be immunized. Immunization is recommended through the age of 6 years for any male who has sex with males and did not get any or all doses earlier. Immunization is recommended for any person with an immunocompromised condition through the age of 49 years if he did not get any or all doses earlier. During the 3-dose series, the second dose should be obtained 4-8 weeks after the first  dose. The third dose should be obtained 24 weeks after the first dose and 16 weeks after the second dose.  Zoster vaccine. One dose is recommended for adults aged 50 years or older unless certain conditions are present.  Measles, mumps, and rubella (MMR) vaccine. Adults born before [birth year] generally are considered immune to measles and mumps. Adults born in [birth year] or later should have 1 or more doses of MMR vaccine unless there is a contraindication to the vaccine or there is laboratory evidence of immunity to each of the three diseases. A routine second dose of MMR vaccine should be obtained at least 28 days after the first dose for students attending postsecondary  schools, health care workers, or international travelers. People who received inactivated measles vaccine or an unknown type of measles vaccine during 1963-1967 should receive 2 doses of MMR vaccine. People who received inactivated mumps vaccine or an unknown type of mumps vaccine before 1979 and are at high risk for mumps infection should consider immunization with 2 doses of MMR vaccine. Unvaccinated health care workers born before 1957 who lack laboratory evidence of measles, mumps, or rubella immunity or laboratory confirmation of disease should consider measles and mumps immunization with 2 doses of MMR vaccine or rubella immunization with 1 dose of MMR vaccine.  Pneumococcal 13-valent conjugate (PCV13) vaccine. When indicated, a person who is uncertain of his immunization history and has no record of immunization should receive the PCV13 vaccine. An adult aged 19 years or older who has certain medical conditions and has not been previously immunized should receive 1 dose of PCV13 vaccine. This PCV13 should be followed with a dose of pneumococcal polysaccharide (PPSV23) vaccine. The PPSV23 vaccine dose should be obtained at least 8 weeks after the dose of PCV13 vaccine. An adult aged 19 years or older who has certain medical conditions and  previously received 1 or more doses of PPSV23 vaccine should receive 1 dose of PCV13. The PCV13 vaccine dose should be obtained 1 or more years after the last PPSV23 vaccine dose.  Pneumococcal polysaccharide (PPSV23) vaccine. When PCV13 is also indicated, PCV13 should be obtained first. All adults aged 65 years and older should be immunized. An adult younger than age 65 years who has certain medical conditions should be immunized. Any person who resides in a nursing home or long-term care facility should be immunized. An adult smoker should be immunized. People with an immunocompromised condition and certain other conditions should receive both PCV13 and PPSV23 vaccines. People with human immunodeficiency virus (HIV) infection should be immunized as soon as possible after diagnosis. Immunization during chemotherapy or radiation therapy should be avoided. Routine use of PPSV23 vaccine is not recommended for American Indians, [place] Natives, or people younger than 65 years unless there are medical conditions that require PPSV23 vaccine. When indicated, people who have unknown immunization and have no record of immunization should receive PPSV23 vaccine. One-time revaccination 5 years after the first dose of PPSV23 is recommended for people aged 19-64 years who have chronic kidney failure, nephrotic syndrome, asplenia, or immunocompromised conditions. People who received 1-2 doses of PPSV23 before age 65 years should receive another dose of PPSV23 vaccine at age 65 years or later if at least 5 years have passed since the previous dose. Doses of PPSV23 are not needed for people immunized with PPSV23 at or after age 65 years.  Meningococcal vaccine. Adults with asplenia or persistent complement component deficiencies should receive 2 doses of quadrivalent meningococcal conjugate (MenACWY-D) vaccine. The doses should be obtained at least 2 months apart. Microbiologists working with certain meningococcal bacteria,  military recruits, people at risk during an outbreak, and people who travel to or live in countries with a high rate of meningitis should be immunized. A first-year college student up through age 21 years who is living in a residence hall should receive a dose if he did not receive a dose on or after his 16th birthday. Adults who have certain high-risk conditions should receive one or more doses of vaccine.  Hepatitis A vaccine. Adults who wish to be protected from this disease, have certain high-risk conditions, work with hepatitis A-infected animals, work in hepatitis A research labs, or   travel to or work in countries with a high rate of hepatitis A should be immunized. Adults who were previously unvaccinated and who anticipate close contact with an international adoptee during the first 60 days after arrival in the [place] from a country with a high rate of hepatitis A should be immunized.  Hepatitis B vaccine. Adults should be immunized if they wish to be protected from this disease, have certain high-risk conditions, may be exposed to blood or other infectious body fluids, are household contacts or sex partners of hepatitis B positive people, are clients or workers in certain care facilities, or travel to or work in countries with a high rate of hepatitis B.  Haemophilus influenzae type b (Hib) vaccine. A previously unvaccinated person with asplenia or sickle cell disease or having a scheduled splenectomy should receive 1 dose of Hib vaccine. Regardless of previous immunization, a recipient of a hematopoietic stem cell transplant should receive a 3-dose series 6-12 months after his successful transplant. Hib vaccine is not recommended for adults with HIV infection. Preventive Service / Frequency Ages 52 to 17  Blood pressure check.** / Every 1 to 2 years.  Lipid and cholesterol check.** / Every 5 years beginning at age 69.  Hepatitis C blood test.** / For any individual with known risks for  hepatitis C.  Skin self-exam. / Monthly.  Influenza vaccine. / Every year.  Tetanus, diphtheria, and acellular pertussis (Tdap, Td) vaccine.** / Consult your health care provider. 1 dose of Td every 10 years.  Varicella vaccine.** / Consult your health care provider.  HPV vaccine. / 3 doses over 6 months, if 72 or younger.  Measles, mumps, rubella (MMR) vaccine.** / You need at least 1 dose of MMR if you were born in 1957 or later. You may also need a second dose.  Pneumococcal 13-valent conjugate (PCV13) vaccine.** / Consult your health care provider.  Pneumococcal polysaccharide (PPSV23) vaccine.** / 1 to 2 doses if you smoke cigarettes or if you have certain conditions.  Meningococcal vaccine.** / 1 dose if you are age 35 to 60 years and a Market researcher living in a residence hall, or have one of several medical conditions. You may also need additional booster doses.  Hepatitis A vaccine.** / Consult your health care provider.  Hepatitis B vaccine.** / Consult your health care provider.  Haemophilus influenzae type b (Hib) vaccine.** / Consult your health care provider. Ages 35 to 8  Blood pressure check.** / Every 1 to 2 years.  Lipid and cholesterol check.** / Every 5 years beginning at age 57.  Lung cancer screening. / Every year if you are aged 44-80 years and have a 30-pack-year history of smoking and currently smoke or have quit within the past 15 years. Yearly screening is stopped once you have quit smoking for at least 15 years or develop a health problem that would prevent you from having lung cancer treatment.  Fecal occult blood test (FOBT) of stool. / Every year beginning at age 55 and continuing until age 73. You may not have to do this test if you get a colonoscopy every 10 years.  Flexible sigmoidoscopy** or colonoscopy.** / Every 5 years for a flexible sigmoidoscopy or every 10 years for a colonoscopy beginning at age 28 and continuing until age  1.  Hepatitis C blood test.** / For all people born from [birth year] through 1965 and any individual with known risks for hepatitis C.  Skin self-exam. / Monthly.  Influenza vaccine. / Every  year.  Tetanus, diphtheria, and acellular pertussis (Tdap/Td) vaccine.** / Consult your health care provider. 1 dose of Td every 10 years.  Varicella vaccine.** / Consult your health care provider.  Zoster vaccine.** / 1 dose for adults aged 53 years or older.  Measles, mumps, rubella (MMR) vaccine.** / You need at least 1 dose of MMR if you were born in 1957 or later. You may also need a second dose.  Pneumococcal 13-valent conjugate (PCV13) vaccine.** / Consult your health care provider.  Pneumococcal polysaccharide (PPSV23) vaccine.** / 1 to 2 doses if you smoke cigarettes or if you have certain conditions.  Meningococcal vaccine.** / Consult your health care provider.  Hepatitis A vaccine.** / Consult your health care provider.  Hepatitis B vaccine.** / Consult your health care provider.  Haemophilus influenzae type b (Hib) vaccine.** / Consult your health care provider. Ages 77 and over  Blood pressure check.** / Every 1 to 2 years.  Lipid and cholesterol check.**/ Every 5 years beginning at age 85.  Lung cancer screening. / Every year if you are aged 55-80 years and have a 30-pack-year history of smoking and currently smoke or have quit within the past 15 years. Yearly screening is stopped once you have quit smoking for at least 15 years or develop a health problem that would prevent you from having lung cancer treatment.  Fecal occult blood test (FOBT) of stool. / Every year beginning at age 33 and continuing until age 11. You may not have to do this test if you get a colonoscopy every 10 years.  Flexible sigmoidoscopy** or colonoscopy.** / Every 5 years for a flexible sigmoidoscopy or every 10 years for a colonoscopy beginning at age 28 and continuing until age 73.  Hepatitis C blood  test.** / For all people born from [birth year] through 1965 and any individual with known risks for hepatitis C.  Abdominal aortic aneurysm (AAA) screening.** / A one-time screening for ages 50 to 27 years who are current or former smokers.  Skin self-exam. / Monthly.  Influenza vaccine. / Every year.  Tetanus, diphtheria, and acellular pertussis (Tdap/Td) vaccine.** / 1 dose of Td every 10 years.  Varicella vaccine.** / Consult your health care provider.  Zoster vaccine.** / 1 dose for adults aged 34 years or older.  Pneumococcal 13-valent conjugate (PCV13) vaccine.** / Consult your health care provider.  Pneumococcal polysaccharide (PPSV23) vaccine.** / 1 dose for all adults aged 63 years and older.  Meningococcal vaccine.** / Consult your health care provider.  Hepatitis A vaccine.** / Consult your health care provider.  Hepatitis B vaccine.** / Consult your health care provider.  Haemophilus influenzae type b (Hib) vaccine.** / Consult your health care provider. **Family history and personal history of risk and conditions may change your health care provider's recommendations. Document Released: 06/24/2001 Document Revised: 05/03/2013 Document Reviewed: 09/23/2010 [facility] Patient Information 2015 [place], [place]. This information is not intended to replace advice given to you by your health care provider. Make sure you discuss any questions you have with your health care provider.

## 2014-03-09 NOTE — Progress Notes (Signed)
Pre visit review using our clinic review tool, if applicable. No additional management support is needed unless otherwise documented below in the visit note. 

## 2014-03-09 NOTE — Progress Notes (Signed)
Subjective:    Patient ID: Raymond Howell, male    DOB: 1961-03-09, 53 y.o.   MRN: [id number]  HPI Pt here for cpe and labs.  No complaints.      Review of Systems Review of Systems  Constitutional: Negative for activity change, appetite change and fatigue.  HENT: Negative for hearing loss, congestion, tinnitus and ear discharge.  dentist q71m Eyes: Negative for visual disturbance (see optho q1y -- vision corrected to 20/20 with glasses).  Respiratory: Negative for cough, chest tightness and shortness of breath.   Cardiovascular: Negative for chest pain, palpitations and leg swelling.  Gastrointestinal: Negative for abdominal pain, diarrhea, constipation and abdominal distention.  Genitourinary: Negative for urgency, frequency, decreased urine volume and difficulty urinating.  Musculoskeletal: Negative for back pain, arthralgias and gait problem.  Skin: Negative for color change, pallor and rash.  Neurological: Negative for dizziness, light-headedness, numbness and headaches.  Hematological: Negative for adenopathy. Does not bruise/bleed easily.  Psychiatric/Behavioral: Negative for suicidal ideas, confusion, sleep disturbance, self-injury, dysphoric mood, decreased concentration and agitation.      Past Medical History  Diagnosis Date  . Basal cell carcinoma of nose   . Umbilical hernia   . Venereal wart   . Hyperlipidemia   . Hypertension   . Family history of heart disease   . Chest pain   . Sleep apnea     CPAP  . Angiodysplasia of colon    Past Surgical History  Procedure Laterality Date  . Hernia repair  4268    umbilical  . Mohs surgery      nose  . Mouth surgery      wisdom teeth  . Appendectomy  1987  . Colonoscopy     Family History  Problem Relation Age of Onset  . Heart disease Father 48    MI died @ 66  . Coronary artery disease Other     1st degree relative  . Cancer Other     skin  . Heart attack Maternal Grandfather 42  . Colon cancer Neg Hx     . Stomach cancer Neg Hx    Current Outpatient Prescriptions  Medication Sig Dispense Refill  . aspirin 81 MG tablet Take 81 mg by mouth daily.        . Flaxseed, Linseed, (FLAXSEED OIL) 1000 MG CAPS Take by mouth 2 (two) times daily.        Marland Kitchen ibuprofen (ADVIL,MOTRIN) 200 MG tablet Take 800 mg by mouth every 6 (six) hours as needed for pain.      . meloxicam (MOBIC) 15 MG tablet Take 1 tablet (15 mg total) by mouth daily.  90 tablet  0  . metaxalone (SKELAXIN) 800 MG tablet Take 800 mg by mouth 4 (four) times daily as needed.      . metoprolol succinate (TOPROL-XL) 100 MG 24 hr tablet Take 1 tablet (100 mg total) by mouth daily.  90 tablet  3  . metoprolol succinate (TOPROL-XL) 100 MG 24 hr tablet Take 100 mg by mouth daily.      . Multiple Vitamin (MULTIVITAMIN) tablet Take 1 tablet by mouth daily.        . Niacin-Simvastatin (SIMCOR) 1000-40 MG TB24 1 po qhs  90 tablet  3  . NON FORMULARY at bedtime. CPAP       No current facility-administered medications for this visit.   No Known Allergies     Objective:   Physical Exam  BP 120/73  Pulse 58  Temp(Src)  98.5 F (36.9 C) (Oral)  Ht 5\' 10"  (1.778 m)  Wt 229 lb 12.8 oz (104.237 kg)  BMI 32.97 kg/m2  SpO2 100% General appearance: alert, cooperative, appears stated age and no distress Head: Normocephalic, without obvious abnormality, atraumatic Eyes: conjunctivae/corneas clear. PERRL, EOM's intact. Fundi benign. Ears: normal TM's and external ear canals both ears Nose: Nares normal. Septum midline. Mucosa normal. No drainage or sinus tenderness. Throat: lips, mucosa, and tongue normal; teeth and gums normal Neck: no adenopathy, no carotid bruit, no JVD, supple, symmetrical, trachea midline and thyroid not enlarged, symmetric, no tenderness/mass/nodules Back: symmetric, no curvature. ROM normal. No CVA tenderness. Lungs: clear to auscultation bilaterally Chest wall: no tenderness Heart: regular rate and rhythm, S1, S2 normal, no  murmur, click, rub or gallop Abdomen: soft, non-tender; bowel sounds normal; no masses,  no organomegaly Male genitalia: normal, penis: no lesions or discharge. testes: no masses or tenderness. no hernias Rectal: normal tone, normal prostate, no masses or tenderness and soft brown guaiac negative stool noted Extremities: extremities normal, atraumatic, no cyanosis or edema Pulses: 2+ and symmetric Skin: Skin color, texture, turgor normal. No rashes or lesions -- few lipomas L flank, NT Lymph nodes: Cervical, supraclavicular, and axillary nodes normal. Neurologic: Alert and oriented X 3, normal strength and tone. Normal symmetric reflexes. Normal coordination and gait Psych- no depression, no anxiety       Assessment & Plan:  1. Essential hypertension Stable, con't meds - metoprolol succinate (TOPROL-XL) 100 MG 24 hr tablet; Take 1 tablet (100 mg total) by mouth daily.  Dispense: 90 tablet; Refill: 3 - Basic metabolic panel - CBC with Differential - Microalbumin / creatinine urine ratio  2. Hyperlipidemia Check labs con't meds - Niacin-Simvastatin (SIMCOR) 1000-40 MG TB24; 1 po qhs  Dispense: 90 tablet; Refill: 3 - Hepatic function panel - Lipid panel - POCT urinalysis dipstick - Microalbumin / creatinine urine ratio  3. Preventative health care ghm utd Check labs - TSH - PSA - Microalbumin / creatinine urine ratio  4. Need for prophylactic vaccination and inoculation against influenza  - Flu Vaccine QUAD 36+ mos PF IM (Fluarix Quad PF)

## 2014-03-10 ENCOUNTER — Telehealth: Payer: Self-pay | Admitting: Family Medicine

## 2014-03-10 ENCOUNTER — Other Ambulatory Visit: Payer: Self-pay | Admitting: Family Medicine

## 2014-03-10 DIAGNOSIS — N410 Acute prostatitis: Secondary | ICD-10-CM

## 2014-03-10 DIAGNOSIS — R972 Elevated prostate specific antigen [PSA]: Secondary | ICD-10-CM

## 2014-03-10 MED ORDER — CIPROFLOXACIN HCL 500 MG PO TABS: 500.0000 mg | ORAL_TABLET | Freq: Two times a day (BID) | ORAL | Status: AC

## 2014-03-10 NOTE — Telephone Encounter (Signed)
[phone] Returning call.

## 2014-03-10 NOTE — Telephone Encounter (Addendum)
Wbc elevated and psa very high. Any signs of prostatitis? Pain with urination-- trouble urinating? We are going to treat you for an infection for 2 weeks then we need to repeat the PSA-- if it is still high we will need to send you to the urologist.        Discussed with patient and he voiced understanding. The Rx was sent earlier today and he has agreed to see Urology.      KP

## 2014-03-10 NOTE — Telephone Encounter (Signed)
Caller name: Keylen, Eckenrode Relation to pt: self  Call back number: [phone]   Reason for call: pt returning your call

## 2014-03-16 ENCOUNTER — Encounter: Payer: Self-pay | Admitting: Family Medicine

## 2014-04-02 ENCOUNTER — Encounter: Payer: Self-pay | Admitting: Family Medicine

## 2014-04-03 ENCOUNTER — Other Ambulatory Visit: Payer: Self-pay | Admitting: Family Medicine

## 2014-04-03 MED ORDER — MELOXICAM 15 MG PO TABS
15.0000 mg | ORAL_TABLET | Freq: Every day | ORAL | Status: DC
Start: 2014-04-03 — End: 2014-09-15

## 2014-05-23 ENCOUNTER — Encounter: Payer: Self-pay | Admitting: Family Medicine

## 2014-05-23 DIAGNOSIS — E785 Hyperlipidemia, unspecified: Secondary | ICD-10-CM

## 2014-05-24 MED ORDER — NIACIN-SIMVASTATIN ER 1000-40 MG PO TB24: ORAL_TABLET | ORAL | Status: DC

## 2014-07-17 ENCOUNTER — Ambulatory Visit (INDEPENDENT_AMBULATORY_CARE_PROVIDER_SITE_OTHER): Payer: Managed Care, Other (non HMO) | Admitting: Cardiovascular Disease

## 2014-07-17 ENCOUNTER — Encounter: Payer: Self-pay | Admitting: Cardiovascular Disease

## 2014-07-17 VITALS — BP 122/86 | HR 58 | Ht 69.5 in | Wt 236.9 lb

## 2014-07-17 DIAGNOSIS — I10 Essential (primary) hypertension: Secondary | ICD-10-CM

## 2014-07-17 NOTE — Assessment & Plan Note (Signed)
History of hyperlipidemia on Simcor with recent lipid profile performed 03/09/14 revealed total cholesterol 125, LDL 56 and HDL of 38

## 2014-07-17 NOTE — Assessment & Plan Note (Signed)
History of hypertension blood pressure measured at 122/86. He is on metoprolol. Continue current meds at current dosing

## 2014-07-17 NOTE — Progress Notes (Signed)
07/17/2014 Raymond Howell   1960-07-03  [id number]  Primary Physician Garnet Koyanagi, DO Primary Cardiologist: Lorretta Harp MD Renae Gloss   HPI:   Mr. 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 I last saw him in the office 08/02/13. His Primary care physician is Dr. Garnet Koyanagi . He was referred because of chest pain. His cardiac risk factor profile is remarkable for family history the father that had his first MI at age 26 and died of a heart attack at age 37. History of hypertension and hyperlipidemia. He does not smoke nor is he diabetic. He developed substernal chest pain with radiation to his left ear occurring 5-6 times . I obtained a Myoview stress test sincesince I saw him 3 months ago he's had no episodes of chest pain. He has lost 20-30 pounds through diet and exercise. Since I saw him a year ago he denies chest pain or shortness of breath.   Current Outpatient Prescriptions  Medication Sig Dispense Refill  . aspirin 81 MG tablet Take 81 mg by mouth daily.      . Flaxseed, Linseed, (FLAXSEED OIL) 1000 MG CAPS Take by mouth 2 (two) times daily.      Marland Kitchen ibuprofen (ADVIL,MOTRIN) 200 MG tablet Take 800 mg by mouth every 6 (six) hours as needed for pain.    . meloxicam (MOBIC) 15 MG tablet Take 1 tablet (15 mg total) by mouth daily. 90 tablet 3  . metaxalone (SKELAXIN) 800 MG tablet Take 800 mg by mouth 4 (four) times daily as needed.    . metoprolol succinate (TOPROL-XL) 100 MG 24 hr tablet Take 1 tablet (100 mg total) by mouth daily. 90 tablet 3  . Multiple Vitamin (MULTIVITAMIN) tablet Take 1 tablet by mouth daily.      . Niacin-Simvastatin (SIMCOR) 1000-40 MG TB24 1 po qhs 90 tablet 3  . NON FORMULARY at bedtime. CPAP     No current facility-administered medications for this visit.    No Known Allergies  History   Social History  . Marital  Status: Married    Spouse Name: N/A  . Number of Children: N/A  . Years of Education: N/A   Occupational History  . Not on file.   Social History Main Topics  . Smoking status: Never Smoker   . Smokeless tobacco: Never Used  . Alcohol Use: 1.5 - 2.0 oz/week    3-4 drink(s) per week     Comment: per week  . Drug Use: No  . Sexual Activity:    Partners: Female   Other Topics Concern  . Not on file   Social History Narrative   Reg exercise--- walking the dogs     Review of Systems: General: negative for chills, fever, night sweats or weight changes.  Cardiovascular: negative for chest pain, dyspnea on exertion, edema, orthopnea, palpitations, paroxysmal nocturnal dyspnea or shortness of breath Dermatological: negative for rash Respiratory: negative for cough or wheezing Urologic: negative for hematuria Abdominal: negative for nausea, vomiting, diarrhea, bright red blood per rectum, melena, or hematemesis Neurologic: negative for visual changes, syncope, or dizziness All other systems reviewed and are otherwise negative except as noted above.    Blood pressure 122/86, pulse 58, height 5' 9.5" (1.765 m), weight 236 lb 14.4 oz (107.457 kg).  General appearance: alert and no distress Neck: no adenopathy, no carotid bruit, no JVD, supple,  symmetrical, trachea midline and thyroid not enlarged, symmetric, no tenderness/mass/nodules Lungs: clear to auscultation bilaterally Heart: regular rate and rhythm, S1, S2 normal, no murmur, click, rub or gallop Extremities: extremities normal, atraumatic, no cyanosis or edema  EKG sinus bradycardia 58 without ST or T-wave changes. I personally reviewed this EKG  ASSESSMENT AND PLAN:   Essential hypertension History of hypertension blood pressure measured at 122/86. He is on metoprolol. Continue current meds at current dosing   HYPERLIPIDEMIA History of hyperlipidemia on Simcor with recent lipid profile performed 03/09/14 revealed total  cholesterol 125, LDL 56 and HDL of [place] MD [facility], [facility] 07/17/2014 3:29 PM

## 2014-07-17 NOTE — Patient Instructions (Signed)
Dr Berry recommends that you schedule a follow-up appointment in 1 year. You will receive a reminder letter in the mail two months in advance. If you don't receive a letter, please call our office to schedule the follow-up appointment. 

## 2014-07-24 ENCOUNTER — Encounter: Payer: Self-pay | Admitting: Family Medicine

## 2014-08-01 ENCOUNTER — Telehealth: Payer: Self-pay | Admitting: Pulmonary Disease

## 2014-08-01 ENCOUNTER — Encounter: Payer: Self-pay | Admitting: Pulmonary Disease

## 2014-08-01 NOTE — Telephone Encounter (Signed)
CPAP 10 cm Few residuals Increase to 12cm Download in 4 weeks.

## 2014-08-03 NOTE — Telephone Encounter (Signed)
Patient notified.  Patient will bring SD card with him at next [place] for download. Nothing further needed.

## 2014-08-03 NOTE — Telephone Encounter (Signed)
lmtcb

## 2014-08-04 ENCOUNTER — Encounter: Payer: Self-pay | Admitting: Pulmonary Disease

## 2014-08-04 DIAGNOSIS — G4730 Sleep apnea, unspecified: Secondary | ICD-10-CM

## 2014-08-07 NOTE — Telephone Encounter (Signed)
3.25.16 mychart message from pt: Message     Received a call from [place] yesterday that Dr. Elsworth Soho wanted the pressure in my CPAP machine turned up from 10 to 12. I went to [facility] this mornign and they will not change the settings until they have an order or prescription from your office. I really need to get this done on Monday as I will be out of town the balance of the week. I will plan to go to [place] during lunch. Can you make sure they have the orders from your office by lunch? Thanks.   Verified in pt's chart that pt was indeed notified on 3.24.16 of pressure increase: Glean Hess, CMA at 08/01/2014 5:10 PM     Status: Signed       Expand All Collapse All   CPAP 10 cm Few residuals Increase to 12cm Download in 4 weeks.       Order sent to [facility] to increase pt's pressure and to send a download to Dr Elsworth Soho in 1 month Pt notified as well Will sign off

## 2014-08-10 ENCOUNTER — Encounter: Payer: Self-pay | Admitting: Cardiovascular Disease

## 2014-08-17 ENCOUNTER — Encounter: Payer: Self-pay | Admitting: Pulmonary Disease

## 2014-08-17 ENCOUNTER — Ambulatory Visit (INDEPENDENT_AMBULATORY_CARE_PROVIDER_SITE_OTHER): Payer: Managed Care, Other (non HMO) | Admitting: Pulmonary Disease

## 2014-08-17 VITALS — BP 121/74 | HR 48 | Temp 97.7°F | Ht 70.0 in | Wt 237.0 lb

## 2014-08-17 DIAGNOSIS — G4733 Obstructive sleep apnea (adult) (pediatric): Secondary | ICD-10-CM | POA: Diagnosis not present

## 2014-08-17 NOTE — Patient Instructions (Signed)
You are set at 12 cm Call if increased snoring or less rested

## 2014-08-17 NOTE — Progress Notes (Signed)
   Subjective:    Patient ID: Raymond Howell, male    DOB: June 12, 1960, 54 y.o.   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  He had a sleep evaluation 2004 (205 lbs )- AHI was 12/ , did not need treatment.  04/2010 PSG (240 lbs ) >> AHI 22/h , lowest desatn 87%, PLMs disappeared with PAP. Central apneas emerged at 11 cm & persisted on BiPAP 13/9.  Tolerated CPAP 9 cm well, full face mask -  download 04/19/10 - 01/07/11 >> few residual hypopneas on 10 cm. Note that he had centrals during titration on 11 cm  Download 04/2013 -CPAP 10 cm ,Mild increase AHI 7/h 46m download 08/2013 - on 10 cm -good usage, no leak,no changes  08/17/2014  Chief Complaint  Patient presents with  . Follow-up    OSA: CPAP working better on new pressure setting, no more snoring. no concerns.   Mild snoring per wife 07/2014 high residuals >> Increased pr to 12 cm  Snoring has stopped, he feels well rested 1 m download 08/2014  On 12 cm >> AHI 8/h, excellent usage, no leak  Review of Systems neg for any significant sore throat, dysphagia, itching, sneezing, nasal congestion or excess/ purulent secretions, fever, chills, sweats, unintended wt loss, pleuritic or exertional cp, hempoptysis, orthopnea pnd or change in chronic leg swelling. Also denies presyncope, palpitations, heartburn, abdominal pain, nausea, vomiting, diarrhea or change in bowel or urinary habits, dysuria,hematuria, rash, arthralgias, visual complaints, headache, numbness weakness or ataxia.      Objective:   Physical Exam  Gen. Pleasant, obese, in no distress ENT - no lesions, no post nasal drip Neck: No JVD, no thyromegaly, no carotid bruits Lungs: no use of accessory muscles, no dullness to percussion, decreased without rales or rhonchi  Cardiovascular: Rhythm regular, heart sounds  normal, no murmurs or gallops, no peripheral edema Musculoskeletal: No deformities, no cyanosis or clubbing , no  tremors       Assessment & Plan:

## 2014-08-18 NOTE — Assessment & Plan Note (Signed)
Better controlled on 12 cm - will accept few residuals  Weight loss encouraged, compliance with goal of at least 4-6 hrs every night is the expectation. Advised against medications with sedative side effects Cautioned against driving when sleepy - understanding that sleepiness will vary on a day to day basis

## 2014-09-08 ENCOUNTER — Ambulatory Visit: Payer: Managed Care, Other (non HMO) | Admitting: Family Medicine

## 2014-09-14 ENCOUNTER — Encounter: Payer: Self-pay | Admitting: Pulmonary Disease

## 2014-09-15 ENCOUNTER — Ambulatory Visit (INDEPENDENT_AMBULATORY_CARE_PROVIDER_SITE_OTHER): Payer: Managed Care, Other (non HMO) | Admitting: Family Medicine

## 2014-09-15 ENCOUNTER — Encounter: Payer: Self-pay | Admitting: Family Medicine

## 2014-09-15 VITALS — BP 122/82 | HR 51 | Temp 97.8°F | Wt 239.0 lb

## 2014-09-15 DIAGNOSIS — M47817 Spondylosis without myelopathy or radiculopathy, lumbosacral region: Secondary | ICD-10-CM | POA: Diagnosis not present

## 2014-09-15 DIAGNOSIS — E785 Hyperlipidemia, unspecified: Secondary | ICD-10-CM | POA: Diagnosis not present

## 2014-09-15 DIAGNOSIS — I10 Essential (primary) hypertension: Secondary | ICD-10-CM | POA: Diagnosis not present

## 2014-09-15 DIAGNOSIS — Z23 Encounter for immunization: Secondary | ICD-10-CM | POA: Diagnosis not present

## 2014-09-15 LAB — MICROALBUMIN / CREATININE URINE RATIO
CREATININE, U: 40.1 mg/dL
MICROALB/CREAT RATIO: 1.7 mg/g (ref 0.0–30.0)
Microalb, Ur: <0.7 mg/dL (ref 0.0–1.9)

## 2014-09-15 LAB — BASIC METABOLIC PANEL
BUN: 15 mg/dL (ref 6–23)
CALCIUM: 9.6 mg/dL (ref 8.4–10.5)
CO2: 31 meq/L (ref 19–32)
CREATININE: 0.79 mg/dL (ref 0.40–1.50)
Chloride: 104 mEq/L (ref 96–112)
GFR: 108.27 mL/min (ref >60.00)
GLUCOSE: 110 mg/dL — AB (ref 70–99)
Potassium: 4.2 mEq/L (ref 3.5–5.1)
Sodium: 139 mEq/L (ref 135–145)

## 2014-09-15 LAB — HEPATIC FUNCTION PANEL
ALBUMIN: 4.7 g/dL (ref 3.5–5.2)
ALT: 43 U/L (ref 0–53)
AST: 35 U/L (ref 0–37)
Alkaline Phosphatase: 59 U/L (ref 39–117)
Bilirubin, Direct: 0.2 mg/dL (ref 0.0–0.3)
TOTAL PROTEIN: 7.6 g/dL (ref 6.0–8.3)
Total Bilirubin: 1.2 mg/dL (ref 0.2–1.2)

## 2014-09-15 LAB — LIPID PANEL
CHOLESTEROL: 120 mg/dL (ref 0–200)
HDL: 46.5 mg/dL (ref >39.00)
LDL CALC: 50 mg/dL (ref 0–99)
NonHDL: 73.5
TRIGLYCERIDES: 116 mg/dL (ref 0.0–149.0)
Total CHOL/HDL Ratio: 3
VLDL: 23.2 mg/dL (ref 0.0–40.0)

## 2014-09-15 MED ORDER — MELOXICAM 15 MG PO TABS: 15.0000 mg | ORAL_TABLET | Freq: Every day | ORAL | Status: DC

## 2014-09-15 NOTE — Progress Notes (Signed)
Patient ID: Raymond Howell, male    DOB: April 10, 1961  Age: 54 y.o. MRN: [id number]    Subjective:  Subjective HPI Raymond Howell presents for f/u bp and cholesterol.  He also needs refill of Mobic.     Review of Systems  Constitutional: Negative for diaphoresis, appetite change, fatigue and unexpected weight change.  Eyes: Negative for pain, redness and visual disturbance.  Respiratory: Negative for cough, chest tightness, shortness of breath and wheezing.   Cardiovascular: Negative for chest pain, palpitations and leg swelling.  Endocrine: Negative for cold intolerance, heat intolerance, polydipsia, polyphagia and polyuria.  Genitourinary: Negative for dysuria, frequency and difficulty urinating.  Neurological: Negative for dizziness, light-headedness, numbness and headaches.  Psychiatric/Behavioral: Negative for decreased concentration. The patient is not nervous/anxious.     History Past Medical History  Diagnosis Date  . Basal cell carcinoma of nose   . Umbilical hernia   . Venereal wart   . Hyperlipidemia   . Hypertension   . Family history of heart disease   . Chest pain   . Sleep apnea     CPAP  . Angiodysplasia of colon     He has past surgical history that includes Hernia repair (2004); Mohs surgery; Mouth surgery; Appendectomy (1987); and Colonoscopy.   His family history includes Cancer in his other; Coronary artery disease in his other; Heart attack (age of onset: 48) in his maternal grandfather; Heart disease (age of onset: 63) in his father. There is no history of Colon cancer or Stomach cancer.He reports that he has never smoked. He has never used smokeless tobacco. He reports that he drinks about 1.5 - 2.0 oz of alcohol per week. He reports that he does not use illicit drugs.  Current Outpatient Prescriptions on File Prior to Visit  Medication Sig Dispense Refill  . aspirin 81 MG tablet Take 81 mg by mouth daily.      . Flaxseed, Linseed, (FLAXSEED OIL) 1000 MG CAPS  Take by mouth 2 (two) times daily.      Marland Kitchen ibuprofen (ADVIL,MOTRIN) 200 MG tablet Take 800 mg by mouth every 6 (six) hours as needed for pain.    . metaxalone (SKELAXIN) 800 MG tablet Take 800 mg by mouth 4 (four) times daily as needed.    . metoprolol succinate (TOPROL-XL) 100 MG 24 hr tablet Take 1 tablet (100 mg total) by mouth daily. 90 tablet 3  . Multiple Vitamin (MULTIVITAMIN) tablet Take 1 tablet by mouth daily.      . Niacin-Simvastatin (SIMCOR) 1000-40 MG TB24 1 po qhs 90 tablet 3  . NON FORMULARY at bedtime. CPAP     No current facility-administered medications on file prior to visit.     Objective:  Objective Physical Exam  Constitutional: He is oriented to person, place, and time. Vital signs are normal. He appears well-developed and well-nourished. He is sleeping.  HENT:  Head: Normocephalic and atraumatic.  Mouth/Throat: Oropharynx is clear and moist.  Eyes: EOM are normal. Pupils are equal, round, and reactive to light.  Neck: Normal range of motion. Neck supple. No thyromegaly present.  Cardiovascular: Normal rate and regular rhythm.   No murmur heard. Pulmonary/Chest: Effort normal and breath sounds normal. No respiratory distress. He has no wheezes. He has no rales. He exhibits no tenderness.  Musculoskeletal: He exhibits no edema or tenderness.  Neurological: He is alert and oriented to person, place, and time.  Skin: Skin is warm and dry.  Psychiatric: He has a normal mood and  affect. His behavior is normal. Judgment and thought content normal.   BP 122/82 mmHg  Pulse 51  Temp(Src) 97.8 F (36.6 C) (Oral)  Wt 239 lb (108.41 kg)  SpO2 98% Wt Readings from Last 3 Encounters:  09/15/14 239 lb (108.41 kg)  08/17/14 237 lb (107.502 kg)  07/17/14 236 lb 14.4 oz (107.457 kg)     Lab Results  Component Value Date   WBC 10.9* 03/09/2014   HGB 15.4 03/09/2014   HCT 44.9 03/09/2014   PLT 231.0 03/09/2014   GLUCOSE 108* 03/09/2014   CHOL 125 03/09/2014   TRIG  152.0* 03/09/2014   HDL 38.30* 03/09/2014   LDLCALC 56 03/09/2014   ALT 32 03/09/2014   AST 27 03/09/2014   NA 138 03/09/2014   K 4.3 03/09/2014   CL 103 03/09/2014   CREATININE 0.8 03/09/2014   BUN 16 03/09/2014   CO2 21 03/09/2014   TSH 1.61 03/09/2014   PSA 13.23* 03/09/2014   HGBA1C 4.8 11/25/2013   MICROALBUR 1.2 03/09/2014    No results found.   Assessment & Plan:  Plan I am having Raymond Howell maintain his aspirin, Flaxseed Oil, multivitamin, ibuprofen, NON FORMULARY, metaxalone, metoprolol succinate, Niacin-Simvastatin, and meloxicam.  Meds ordered this encounter  Medications  . meloxicam (MOBIC) 15 MG tablet    Sig: Take 1 tablet (15 mg total) by mouth daily.    Dispense:  90 tablet    Refill:  3    Problem List Items Addressed This Visit    Essential hypertension   Relevant Orders   Basic metabolic panel   POCT urinalysis dipstick   Microalbumin / creatinine urine ratio    Other Visit Diagnoses    Need for diphtheria-tetanus-pertussis (Tdap) vaccine, adult/adolescent    -  Primary    Relevant Orders    Tdap vaccine greater than or equal to 7yo IM (Completed)    Spondylosis of lumbosacral region without myelopathy or radiculopathy        Relevant Medications    meloxicam (MOBIC) 15 MG tablet    Hyperlipidemia        Relevant Orders    Hepatic function panel    Lipid panel    POCT urinalysis dipstick    Microalbumin / creatinine urine ratio       Follow-up: Return in about 6 months (around 03/18/2015), or if symptoms worsen or fail to improve, for f/u and labs.  Garnet Koyanagi, DO

## 2014-09-15 NOTE — Progress Notes (Signed)
Pre visit review using our clinic review tool, if applicable. No additional management support is needed unless otherwise documented below in the visit note. 

## 2014-09-19 ENCOUNTER — Telehealth: Payer: Self-pay | Admitting: Pulmonary Disease

## 2014-09-19 LAB — POCT URINALYSIS DIPSTICK
BILIRUBIN UA: NEGATIVE
Blood, UA: NEGATIVE
Glucose, UA: NEGATIVE
Ketones, UA: NEGATIVE
Leukocytes, UA: NEGATIVE
NITRITE UA: NEGATIVE
Protein, UA: NEGATIVE
Spec Grav, UA: 1.015
UROBILINOGEN UA: 0.2
pH, UA: 6

## 2014-09-19 NOTE — Telephone Encounter (Signed)
Few residuals on 12cm Continue current settings

## 2014-09-20 NOTE — Telephone Encounter (Signed)
Spoke with pt, aware of results.  Nothing further needed.  

## 2014-09-20 NOTE — Telephone Encounter (Signed)
lmtcb

## 2014-09-20 NOTE — Telephone Encounter (Signed)
Pt returning call and said to please call his cell # [phone]

## 2014-10-31 ENCOUNTER — Encounter: Payer: Self-pay | Admitting: Pulmonary Disease

## 2014-11-30 ENCOUNTER — Encounter: Payer: Self-pay | Admitting: Family Medicine

## 2014-11-30 DIAGNOSIS — E785 Hyperlipidemia, unspecified: Secondary | ICD-10-CM

## 2014-12-01 MED ORDER — NIACIN-SIMVASTATIN ER 1000-40 MG PO TB24: ORAL_TABLET | ORAL | Status: DC

## 2014-12-17 ENCOUNTER — Encounter: Payer: Self-pay | Admitting: Pulmonary Disease

## 2014-12-28 ENCOUNTER — Telehealth: Payer: Self-pay | Admitting: Pulmonary Disease

## 2014-12-28 DIAGNOSIS — G4733 Obstructive sleep apnea (adult) (pediatric): Secondary | ICD-10-CM

## 2014-12-28 NOTE — Telephone Encounter (Signed)
Patient notified. Order entered. Nothing further needed.

## 2014-12-28 NOTE — Telephone Encounter (Signed)
AHI: 10.2 Patient feels like he his not getting enough air  Per Elsworth Soho:  Increase CPAP to 14cm Download in 4 weeks.

## 2015-01-04 ENCOUNTER — Encounter: Payer: Self-pay | Admitting: Pulmonary Disease

## 2015-03-12 ENCOUNTER — Telehealth: Payer: Self-pay | Admitting: Behavioral Health

## 2015-03-12 ENCOUNTER — Encounter: Payer: Self-pay | Admitting: Behavioral Health

## 2015-03-12 NOTE — Telephone Encounter (Signed)
Unable to reach patient at time of Pre-Visit Call.  Left message for patient to return call when available.    

## 2015-03-12 NOTE — Addendum Note (Signed)
Addended by: Eduard Roux E on: 03/12/2015 12:24 PM   Modules accepted: Medications

## 2015-03-12 NOTE — Telephone Encounter (Signed)
Pre-Visit Call completed with patient and chart updated.   Pre-Visit Info documented in Specialty Comments under [employer].    

## 2015-03-13 ENCOUNTER — Ambulatory Visit (INDEPENDENT_AMBULATORY_CARE_PROVIDER_SITE_OTHER): Payer: Managed Care, Other (non HMO) | Admitting: Family Medicine

## 2015-03-13 ENCOUNTER — Encounter: Payer: Self-pay | Admitting: Family Medicine

## 2015-03-13 VITALS — BP 124/86 | HR 55 | Temp 98.5°F | Ht 70.0 in | Wt 245.8 lb

## 2015-03-13 DIAGNOSIS — Z23 Encounter for immunization: Secondary | ICD-10-CM

## 2015-03-13 DIAGNOSIS — I10 Essential (primary) hypertension: Secondary | ICD-10-CM | POA: Diagnosis not present

## 2015-03-13 DIAGNOSIS — Z1159 Encounter for screening for other viral diseases: Secondary | ICD-10-CM

## 2015-03-13 DIAGNOSIS — Z0000 Encounter for general adult medical examination without abnormal findings: Secondary | ICD-10-CM

## 2015-03-13 DIAGNOSIS — Z114 Encounter for screening for human immunodeficiency virus [HIV]: Secondary | ICD-10-CM

## 2015-03-13 DIAGNOSIS — E785 Hyperlipidemia, unspecified: Secondary | ICD-10-CM

## 2015-03-13 LAB — CBC WITH DIFFERENTIAL/PLATELET
BASOS ABS: 0 10*3/uL (ref 0.0–0.1)
Basophils Relative: 0.5 % (ref 0.0–3.0)
EOS ABS: 0.2 10*3/uL (ref 0.0–0.7)
EOS PCT: 2.6 % (ref 0.0–5.0)
HCT: 48.3 % (ref 39.0–52.0)
HEMOGLOBIN: 16.4 g/dL (ref 13.0–17.0)
LYMPHS ABS: 1.8 10*3/uL (ref 0.7–4.0)
Lymphocytes Relative: 24.6 % (ref 12.0–46.0)
MCHC: 34 g/dL (ref 30.0–36.0)
MCV: 94.9 fl (ref 78.0–100.0)
MONO ABS: 0.6 10*3/uL (ref 0.1–1.0)
Monocytes Relative: 7.4 % (ref 3.0–12.0)
NEUTROS PCT: 64.9 % (ref 43.0–77.0)
Neutro Abs: 4.9 10*3/uL (ref 1.4–7.7)
Platelets: 182 10*3/uL (ref 150.0–400.0)
RBC: 5.09 Mil/uL (ref 4.22–5.81)
RDW: 12.6 % (ref 11.5–15.5)
WBC: 7.5 10*3/uL (ref 4.0–10.5)

## 2015-03-13 LAB — COMPREHENSIVE METABOLIC PANEL
ALBUMIN: 4.6 g/dL (ref 3.5–5.2)
ALK PHOS: 56 U/L (ref 39–117)
ALT: 35 U/L (ref 0–53)
AST: 30 U/L (ref 0–37)
BILIRUBIN TOTAL: 1.1 mg/dL (ref 0.2–1.2)
BUN: 14 mg/dL (ref 6–23)
CO2: 27 mEq/L (ref 19–32)
CREATININE: 0.8 mg/dL (ref 0.40–1.50)
Calcium: 9.6 mg/dL (ref 8.4–10.5)
Chloride: 106 mEq/L (ref 96–112)
GFR: 106.52 mL/min (ref >60.00)
GLUCOSE: 114 mg/dL — AB (ref 70–99)
POTASSIUM: 4.1 meq/L (ref 3.5–5.1)
SODIUM: 141 meq/L (ref 135–145)
TOTAL PROTEIN: 7 g/dL (ref 6.0–8.3)

## 2015-03-13 LAB — POCT URINALYSIS DIPSTICK
Bilirubin, UA: NEGATIVE
Blood, UA: NEGATIVE
GLUCOSE UA: NEGATIVE
KETONES UA: NEGATIVE
LEUKOCYTES UA: NEGATIVE
Nitrite, UA: NEGATIVE
PROTEIN UA: NEGATIVE
SPEC GRAV UA: 1.01
Urobilinogen, UA: 0.2
pH, UA: 6

## 2015-03-13 LAB — TSH: TSH: 0.98 u[IU]/mL (ref 0.35–4.50)

## 2015-03-13 LAB — LIPID PANEL
CHOLESTEROL: 117 mg/dL (ref 0–200)
HDL: 42.8 mg/dL (ref >39.00)
LDL CALC: 53 mg/dL (ref 0–99)
NonHDL: 74.14
TRIGLYCERIDES: 108 mg/dL (ref 0.0–149.0)
Total CHOL/HDL Ratio: 3
VLDL: 21.6 mg/dL (ref 0.0–40.0)

## 2015-03-13 LAB — HEPATITIS C ANTIBODY: HCV AB: NEGATIVE

## 2015-03-13 LAB — PSA: PSA: 1.04 ng/mL (ref 0.10–4.00)

## 2015-03-13 MED ORDER — NIACIN ER 1000 MG PO TBCR: 1.0000 | EXTENDED_RELEASE_TABLET | Freq: Every day | ORAL | Status: DC

## 2015-03-13 MED ORDER — SIMVASTATIN 40 MG PO TABS: 40.0000 mg | ORAL_TABLET | Freq: Every day | ORAL | Status: DC

## 2015-03-13 MED ORDER — METOPROLOL SUCCINATE ER 100 MG PO TB24: 100.0000 mg | ORAL_TABLET | Freq: Every day | ORAL | Status: DC

## 2015-03-13 NOTE — Progress Notes (Signed)
Pre visit review using our clinic review tool, if applicable. No additional management support is needed unless otherwise documented below in the visit note. 

## 2015-03-13 NOTE — Patient Instructions (Signed)

## 2015-03-13 NOTE — Assessment & Plan Note (Signed)
ghm utd Check labs See AVS 

## 2015-03-13 NOTE — Progress Notes (Signed)
Patient ID: Raymond Howell, male    DOB: [birth year]  Age: 54 y.o. MRN: [id number]    Subjective:  Subjective HPI Raymond Howell presents for cpe and labs.  No complaints.   Review of Systems  Constitutional: Negative.   HENT: Negative for congestion, ear pain, hearing loss, nosebleeds, postnasal drip, rhinorrhea, sinus pressure, sneezing and tinnitus.   Eyes: Negative for photophobia, discharge, itching and visual disturbance.  Respiratory: Negative.   Cardiovascular: Negative.   Gastrointestinal: Negative for abdominal pain, constipation, blood in stool, abdominal distention and anal bleeding.  Endocrine: Negative.   Genitourinary: Negative.   Musculoskeletal: Negative.   Skin: Negative.   Allergic/Immunologic: Negative.   Neurological: Negative for dizziness, weakness, light-headedness, numbness and headaches.  Psychiatric/Behavioral: Negative for suicidal ideas, confusion, sleep disturbance, dysphoric mood, decreased concentration and agitation. The patient is not nervous/anxious.     History Past Medical History  Diagnosis Date  . Basal cell carcinoma of nose   . Umbilical hernia   . Venereal wart   . Hyperlipidemia   . Hypertension   . Family history of heart disease   . Chest pain   . Sleep apnea     CPAP  . Angiodysplasia of colon     He has past surgical history that includes Hernia repair (2004); Mohs surgery; Mouth surgery; Appendectomy (1987); and Colonoscopy.   His family history includes Cancer in his other; Coronary artery disease in his other; Heart attack (age of onset: 81) in his maternal grandfather; Heart disease (age of onset: 62) in his father. There is no history of Colon cancer or Stomach cancer.He reports that he has never smoked. He has never used smokeless tobacco. He reports that he drinks about 1.5 - 2.0 oz of alcohol per week. He reports that he does not use illicit drugs.  Current Outpatient Prescriptions on File Prior to Visit  Medication Sig  Dispense Refill  . aspirin 81 MG tablet Take 81 mg by mouth daily.      . Flaxseed, Linseed, (FLAXSEED OIL) 1000 MG CAPS Take by mouth 2 (two) times daily.      Marland Kitchen ibuprofen (ADVIL,MOTRIN) 200 MG tablet Take 800 mg by mouth every 6 (six) hours as needed for pain.    . meloxicam (MOBIC) 15 MG tablet Take 1 tablet (15 mg total) by mouth daily. 90 tablet 3  . metaxalone (SKELAXIN) 800 MG tablet Take 800 mg by mouth 4 (four) times daily as needed.    . Multiple Vitamin (MULTIVITAMIN) tablet Take 1 tablet by mouth daily.      . Niacin-Simvastatin (SIMCOR) 1000-40 MG TB24 1 po qhs 30 tablet 0  . NON FORMULARY at bedtime. CPAP     No current facility-administered medications on file prior to visit.     Objective:  Objective Physical Exam  Constitutional: He is oriented to person, place, and time. He appears well-developed and well-nourished. No distress.  HENT:  Head: Normocephalic and atraumatic.  Right Ear: External ear normal.  Left Ear: External ear normal.  Nose: Nose normal.  Mouth/Throat: Oropharynx is clear and moist. No oropharyngeal exudate.  Eyes: Conjunctivae and EOM are normal. Pupils are equal, round, and reactive to light. Right eye exhibits no discharge. Left eye exhibits no discharge.  Neck: Normal range of motion. Neck supple. No JVD present. No thyromegaly present.  Cardiovascular: Normal rate, regular rhythm and intact distal pulses.  Exam reveals no gallop and no friction rub.   No murmur heard. Pulmonary/Chest: Effort normal and breath  sounds normal. No respiratory distress. He has no wheezes. He has no rales. He exhibits no tenderness.  Abdominal: Soft. Bowel sounds are normal. He exhibits no distension and no mass. There is no tenderness. There is no rebound and no guarding.  Genitourinary:  Pt sees urology in February  Musculoskeletal: Normal range of motion. He exhibits no edema or tenderness.  Lymphadenopathy:    He has no cervical adenopathy.  Neurological: He is  alert and oriented to person, place, and time. He displays normal reflexes. He exhibits normal muscle tone.  Skin: Skin is warm and dry. No rash noted. He is not diaphoretic. No erythema. No pallor.  Psychiatric: He has a normal mood and affect. His behavior is normal. Judgment and thought content normal.  Nursing note and vitals reviewed.  BP 124/86 mmHg  Pulse 55  Temp(Src) 98.5 F (36.9 C) (Oral)  Ht '5\' 10"'  (1.778 m)  Wt 245 lb 12.8 oz (111.494 kg)  BMI 35.27 kg/m2  SpO2 98% Wt Readings from Last 3 Encounters:  03/13/15 245 lb 12.8 oz (111.494 kg)  09/15/14 239 lb (108.41 kg)  08/17/14 237 lb (107.502 kg)     Lab Results  Component Value Date   WBC 10.9* 03/09/2014   HGB 15.4 03/09/2014   HCT 44.9 03/09/2014   PLT 231.0 03/09/2014   GLUCOSE 110* 09/15/2014   CHOL 120 09/15/2014   TRIG 116.0 09/15/2014   HDL 46.50 09/15/2014   LDLCALC 50 09/15/2014   ALT 43 09/15/2014   AST 35 09/15/2014   NA 139 09/15/2014   K 4.2 09/15/2014   CL 104 09/15/2014   CREATININE 0.79 09/15/2014   BUN 15 09/15/2014   CO2 31 09/15/2014   TSH 1.61 03/09/2014   PSA 13.23* 03/09/2014   HGBA1C 4.8 11/25/2013   MICROALBUR <0.7 09/15/2014    No results found.   Assessment & Plan:  Plan I am having Mr. Raymond Howell start on simvastatin and Niacin CR. I am also having him maintain his aspirin, Flaxseed Oil, multivitamin, ibuprofen, NON FORMULARY, metaxalone, meloxicam, Niacin-Simvastatin, and metoprolol succinate.  Meds ordered this encounter  Medications  . metoprolol succinate (TOPROL-XL) 100 MG 24 hr tablet    Sig: Take 1 tablet (100 mg total) by mouth daily.    Dispense:  90 tablet    Refill:  3  . simvastatin (ZOCOR) 40 MG tablet    Sig: Take 1 tablet (40 mg total) by mouth at bedtime.    Dispense:  90 tablet    Refill:  3  . Niacin CR 1000 MG TBCR    Sig: Take 1 tablet (1,000 mg total) by mouth daily.    Dispense:  90 each    Refill:  3    Problem List Items Addressed This Visit      Preventative health care - Primary    ghm utd Check labs See AVS       Relevant Orders   Comp Met (CMET)   CBC with Differential/Platelet   Lipid panel   POCT urinalysis dipstick   PSA   TSH   Essential hypertension   Relevant Medications   metoprolol succinate (TOPROL-XL) 100 MG 24 hr tablet   simvastatin (ZOCOR) 40 MG tablet   Other Relevant Orders   POCT urinalysis dipstick    Other Visit Diagnoses    Hyperlipidemia        Relevant Medications    metoprolol succinate (TOPROL-XL) 100 MG 24 hr tablet    simvastatin (ZOCOR) 40 MG tablet  Niacin CR 1000 MG TBCR    Other Relevant Orders    Comp Met (CMET)    Lipid panel    Need for immunization against influenza        Relevant Orders    Flu Vaccine QUAD 36+ mos IM (Fluarix) (Completed)    Need for hepatitis C screening test        Relevant Orders    Hepatitis C antibody    Encounter for screening for HIV        Relevant Orders    HIV antibody       Follow-up: Return in about 6 months (around 09/10/2015) for hypertension, hyperlipidemia.  Garnet Koyanagi, DO

## 2015-03-14 LAB — HIV ANTIBODY (ROUTINE TESTING W REFLEX): HIV 1&2 Ab, 4th Generation: NONREACTIVE

## 2015-05-23 ENCOUNTER — Encounter: Payer: Self-pay | Admitting: Family Medicine

## 2015-05-24 ENCOUNTER — Ambulatory Visit (INDEPENDENT_AMBULATORY_CARE_PROVIDER_SITE_OTHER): Payer: Managed Care, Other (non HMO) | Admitting: Family Medicine

## 2015-05-24 ENCOUNTER — Encounter: Payer: Self-pay | Admitting: Family Medicine

## 2015-05-24 VITALS — BP 138/90 | HR 58 | Temp 98.3°F | Wt 241.2 lb

## 2015-05-24 DIAGNOSIS — J0140 Acute pansinusitis, unspecified: Secondary | ICD-10-CM | POA: Diagnosis not present

## 2015-05-24 MED ORDER — AMOXICILLIN-POT CLAVULANATE 875-125 MG PO TABS: 1.0000 | ORAL_TABLET | Freq: Two times a day (BID) | ORAL | Status: DC

## 2015-05-24 MED ORDER — FLUTICASONE PROPIONATE 50 MCG/ACT NA SUSP: 2.0000 | Freq: Every day | NASAL | Status: DC

## 2015-05-24 NOTE — Progress Notes (Signed)
Pre visit review using our clinic review tool, if applicable. No additional management support is needed unless otherwise documented below in the visit note. 

## 2015-05-24 NOTE — Progress Notes (Signed)
  Subjective:     Raymond Howell is a 55 y.o. male who presents for evaluation of sinus pain. Symptoms include: congestion, cough, facial pain, headaches, nasal congestion, purulent rhinorrhea, sinus pressure and tooth pain. Onset of symptoms was 2 weeks ago. Symptoms have been gradually worsening since that time. Past history is significant for no history of pneumonia or bronchitis. Patient is a non-smoker.  The following portions of the patient's history were reviewed and updated as appropriate:  He  has a past medical history of Basal cell carcinoma of nose; Umbilical hernia; Venereal wart; Hyperlipidemia; Hypertension; Family history of heart disease; Chest pain; Sleep apnea; and Angiodysplasia of colon. He  does not have any pertinent problems on file. He  has past surgical history that includes Hernia repair (2004); Mohs surgery; Mouth surgery; Appendectomy (1987); and Colonoscopy. His family history includes Cancer in his other; Coronary artery disease in his other; Heart attack (age of onset: 60) in his maternal grandfather; Heart disease (age of onset: 61) in his father. There is no history of Colon cancer or Stomach cancer. He  reports that he has never smoked. He has never used smokeless tobacco. He reports that he drinks about 1.5 - 2.0 oz of alcohol per week. He reports that he does not use illicit drugs. He has a current medication list which includes the following prescription(s): aspirin, flaxseed oil, ibuprofen, meloxicam, metaxalone, metoprolol succinate, multivitamin, niacin cr, NON FORMULARY, and simvastatin. Current Outpatient Prescriptions on File Prior to Visit  Medication Sig Dispense Refill  . aspirin 81 MG tablet Take 81 mg by mouth daily.      . Flaxseed, Linseed, (FLAXSEED OIL) 1000 MG CAPS Take by mouth 2 (two) times daily.      Marland Kitchen ibuprofen (ADVIL,MOTRIN) 200 MG tablet Take 800 mg by mouth every 6 (six) hours as needed for pain.    . meloxicam (MOBIC) 15 MG tablet Take 1  tablet (15 mg total) by mouth daily. 90 tablet 3  . metaxalone (SKELAXIN) 800 MG tablet Take 800 mg by mouth 4 (four) times daily as needed.    . metoprolol succinate (TOPROL-XL) 100 MG 24 hr tablet Take 1 tablet (100 mg total) by mouth daily. 90 tablet 3  . Multiple Vitamin (MULTIVITAMIN) tablet Take 1 tablet by mouth daily.      . Niacin CR 1000 MG TBCR Take 1 tablet (1,000 mg total) by mouth daily. 90 each 3  . NON FORMULARY at bedtime. CPAP    . simvastatin (ZOCOR) 40 MG tablet Take 1 tablet (40 mg total) by mouth at bedtime. 90 tablet 3   No current facility-administered medications on file prior to visit.   He has No Known Allergies..  Review of Systems Pertinent items are noted in HPI.   Objective:    BP 138/90 mmHg  Pulse 58  Temp(Src) 98.3 F (36.8 C) (Oral)  Wt 241 lb 3.2 oz (109.408 kg)  SpO2 98% General appearance: alert, cooperative, appears stated age and no distress Ears: fluid . b/l  Nose: green discharge, moderate congestion, turbinates red, swollen, sinus tenderness bilateral Throat: lips, mucosa, and tongue normal; teeth and gums normal Neck: mild anterior cervical adenopathy and thyroid not enlarged, symmetric, no tenderness/mass/nodules Lungs: clear to auscultation bilaterally Heart: S1, S2 normal    Assessment:    Acute bacterial sinusitis.    Plan:    Nasal saline sprays. Nasal steroids per medication orders. Antihistamines per medication orders. Augmentin per medication orders. f/u prn

## 2015-05-24 NOTE — Patient Instructions (Signed)

## 2015-06-01 ENCOUNTER — Encounter: Payer: Self-pay | Admitting: Family Medicine

## 2015-06-01 ENCOUNTER — Other Ambulatory Visit: Payer: Self-pay | Admitting: Family Medicine

## 2015-06-01 DIAGNOSIS — J0140 Acute pansinusitis, unspecified: Secondary | ICD-10-CM

## 2015-06-01 MED ORDER — DOXYCYCLINE HYCLATE 100 MG PO TABS: 100.0000 mg | ORAL_TABLET | Freq: Two times a day (BID) | ORAL | Status: DC

## 2015-06-03 ENCOUNTER — Encounter: Payer: Self-pay | Admitting: Family Medicine

## 2015-06-03 DIAGNOSIS — M47817 Spondylosis without myelopathy or radiculopathy, lumbosacral region: Secondary | ICD-10-CM

## 2015-06-04 MED ORDER — MELOXICAM 15 MG PO TABS: 15.0000 mg | ORAL_TABLET | Freq: Every day | ORAL | Status: DC

## 2015-06-04 NOTE — Telephone Encounter (Signed)
mobic 15 mg 1 po qd, #90  1 refill

## 2015-06-04 NOTE — Telephone Encounter (Signed)
Please advise      KP 

## 2016-03-18 ENCOUNTER — Ambulatory Visit (INDEPENDENT_AMBULATORY_CARE_PROVIDER_SITE_OTHER): Payer: Managed Care, Other (non HMO) | Admitting: Family Medicine

## 2016-03-18 ENCOUNTER — Encounter: Payer: Self-pay | Admitting: Family Medicine

## 2016-03-18 VITALS — BP 138/98 | HR 55 | Temp 98.4°F | Resp 16 | Ht 70.0 in | Wt 243.6 lb

## 2016-03-18 DIAGNOSIS — E785 Hyperlipidemia, unspecified: Secondary | ICD-10-CM

## 2016-03-18 DIAGNOSIS — Z0000 Encounter for general adult medical examination without abnormal findings: Secondary | ICD-10-CM | POA: Diagnosis not present

## 2016-03-18 DIAGNOSIS — Z1159 Encounter for screening for other viral diseases: Secondary | ICD-10-CM | POA: Diagnosis not present

## 2016-03-18 DIAGNOSIS — Z23 Encounter for immunization: Secondary | ICD-10-CM

## 2016-03-18 DIAGNOSIS — I10 Essential (primary) hypertension: Secondary | ICD-10-CM

## 2016-03-18 LAB — CBC WITH DIFFERENTIAL/PLATELET
BASOS ABS: 0 10*3/uL (ref 0.0–0.1)
Basophils Relative: 0.4 % (ref 0.0–3.0)
EOS ABS: 0.2 10*3/uL (ref 0.0–0.7)
Eosinophils Relative: 1.9 % (ref 0.0–5.0)
HEMATOCRIT: 46.4 % (ref 39.0–52.0)
HEMOGLOBIN: 16 g/dL (ref 13.0–17.0)
LYMPHS PCT: 20.8 % (ref 12.0–46.0)
Lymphs Abs: 1.8 10*3/uL (ref 0.7–4.0)
MCHC: 34.6 g/dL (ref 30.0–36.0)
MCV: 94.2 fl (ref 78.0–100.0)
Monocytes Absolute: 0.6 10*3/uL (ref 0.1–1.0)
Monocytes Relative: 6.9 % (ref 3.0–12.0)
Neutro Abs: 6.2 10*3/uL (ref 1.4–7.7)
Neutrophils Relative %: 70 % (ref 43.0–77.0)
PLATELETS: 198 10*3/uL (ref 150.0–400.0)
RBC: 4.92 Mil/uL (ref 4.22–5.81)
RDW: 12.6 % (ref 11.5–15.5)
WBC: 8.8 10*3/uL (ref 4.0–10.5)

## 2016-03-18 LAB — COMPREHENSIVE METABOLIC PANEL
ALT: 27 U/L (ref 9–46)
AST: 27 U/L (ref 10–35)
Albumin: 4.6 g/dL (ref 3.6–5.1)
Alkaline Phosphatase: 42 U/L (ref 40–115)
BUN: 17 mg/dL (ref 7–25)
CHLORIDE: 103 mmol/L (ref 98–110)
CO2: 25 mmol/L (ref 20–31)
CREATININE: 0.87 mg/dL (ref 0.70–1.33)
Calcium: 9.5 mg/dL (ref 8.6–10.3)
GLUCOSE: 123 mg/dL — AB (ref 65–99)
Potassium: 4.2 mmol/L (ref 3.5–5.3)
SODIUM: 140 mmol/L (ref 135–146)
TOTAL PROTEIN: 6.9 g/dL (ref 6.1–8.1)
Total Bilirubin: 1.3 mg/dL — ABNORMAL HIGH (ref 0.2–1.2)

## 2016-03-18 LAB — MICROALBUMIN / CREATININE URINE RATIO
CREATININE, U: 60.8 mg/dL
Microalb Creat Ratio: 1.2 mg/g (ref 0.0–30.0)

## 2016-03-18 LAB — LIPID PANEL
CHOL/HDL RATIO: 3.4 ratio (ref <5.0)
CHOLESTEROL: 152 mg/dL (ref <200)
HDL: 45 mg/dL (ref >40)
LDL Cholesterol: 74 mg/dL
Triglycerides: 164 mg/dL — ABNORMAL HIGH (ref <150)
VLDL: 33 mg/dL — AB (ref <30)

## 2016-03-18 LAB — TSH: TSH: 1.1 m[IU]/L (ref 0.40–4.50)

## 2016-03-18 LAB — HEPATITIS C ANTIBODY: HCV AB: NEGATIVE

## 2016-03-18 LAB — PSA: PSA: 1.5 ng/mL (ref <=4.0)

## 2016-03-18 MED ORDER — NIACIN ER 1000 MG PO TBCR: 1.0000 | EXTENDED_RELEASE_TABLET | Freq: Every day | ORAL | 3 refills | Status: DC

## 2016-03-18 MED ORDER — SIMVASTATIN 40 MG PO TABS: 40.0000 mg | ORAL_TABLET | Freq: Every day | ORAL | 3 refills | Status: DC

## 2016-03-18 MED ORDER — METOPROLOL SUCCINATE ER 100 MG PO TB24: 100.0000 mg | ORAL_TABLET | Freq: Every day | ORAL | 3 refills | Status: DC

## 2016-03-18 NOTE — Progress Notes (Signed)
Pre visit review using our clinic review tool, if applicable. No additional management support is needed unless otherwise documented below in the visit note. 

## 2016-03-18 NOTE — Progress Notes (Signed)
Patient ID: Raymond Howell, male    DOB: 02-06-1961  Age: 55 y.o. MRN: [id number]    Subjective:  Subjective  HPI T Raymond Howell presents for cpe and labs.  Pt struggling to lose weight.  He exercises when he can and him and his wife are trying to eat healthier.    Review of Systems  Constitutional: Negative.  Negative for chills and fever.  HENT: Negative for congestion, ear pain, hearing loss, nosebleeds, postnasal drip, rhinorrhea, sinus pressure, sneezing and tinnitus.   Eyes: Negative for photophobia, discharge, itching and visual disturbance.  Respiratory: Negative.  Negative for cough and shortness of breath.   Cardiovascular: Negative.  Negative for chest pain, palpitations and leg swelling.  Gastrointestinal: Negative for abdominal distention, abdominal pain, anal bleeding, blood in stool, constipation, diarrhea, nausea and vomiting.  Endocrine: Negative.   Genitourinary: Negative.  Negative for dysuria, frequency, hematuria and urgency.  Musculoskeletal: Negative.  Negative for back pain and myalgias.  Skin: Negative.  Negative for rash.  Allergic/Immunologic: Negative.  Negative for environmental allergies.  Neurological: Negative for dizziness, weakness, light-headedness, numbness and headaches.  Hematological: Does not bruise/bleed easily.  Psychiatric/Behavioral: Negative for agitation, confusion, decreased concentration, dysphoric mood, sleep disturbance and suicidal ideas. The patient is not nervous/anxious.     History Past Medical History:  Diagnosis Date  . Angiodysplasia of colon   . Basal cell carcinoma of nose   . Chest pain   . Family history of heart disease   . Hyperlipidemia   . Hypertension   . Sleep apnea    CPAP  . Umbilical hernia   . Venereal wart     He has a past surgical history that includes Hernia repair (2004); Mohs surgery; Mouth surgery; Appendectomy (1987); and Colonoscopy.   His family history includes Cancer in his other; Coronary artery  disease in his other; Heart attack (age of onset: 58) in his maternal grandfather; Heart disease (age of onset: 47) in his father.He reports that he has never smoked. He has never used smokeless tobacco. He reports that he drinks about 1.5 - 2.0 oz of alcohol per week . He reports that he does not use drugs.  Current Outpatient Prescriptions on File Prior to Visit  Medication Sig Dispense Refill  . aspirin 81 MG tablet Take 81 mg by mouth daily.      . Flaxseed, Linseed, (FLAXSEED OIL) 1000 MG CAPS Take by mouth 2 (two) times daily.      Marland Kitchen ibuprofen (ADVIL,MOTRIN) 200 MG tablet Take 800 mg by mouth every 6 (six) hours as needed for pain.    . meloxicam (MOBIC) 15 MG tablet Take 1 tablet (15 mg total) by mouth daily. 90 tablet 1  . Multiple Vitamin (MULTIVITAMIN) tablet Take 1 tablet by mouth daily.      . NON FORMULARY at bedtime. CPAP    . fluticasone (FLONASE) 50 MCG/ACT nasal spray Place 2 sprays into both nostrils daily. (Patient not taking: Reported on 03/18/2016) 16 g 6  . metaxalone (SKELAXIN) 800 MG tablet Take 800 mg by mouth 4 (four) times daily as needed.     No current facility-administered medications on file prior to visit.      Objective:  Objective  Physical Exam  Constitutional: He is oriented to person, place, and time. He appears well-developed and well-nourished. No distress.  HENT:  Head: Normocephalic and atraumatic.  Right Ear: External ear normal.  Left Ear: External ear normal.  Nose: Nose normal.  Mouth/Throat: Oropharynx is  clear and moist. No oropharyngeal exudate.  Eyes: Conjunctivae and EOM are normal. Pupils are equal, round, and reactive to light. Right eye exhibits no discharge. Left eye exhibits no discharge.  Neck: Normal range of motion. Neck supple. No JVD present. No thyromegaly present.  Cardiovascular: Normal rate, regular rhythm and intact distal pulses.  Exam reveals no gallop and no friction rub.   No murmur heard. Pulmonary/Chest: Effort normal  and breath sounds normal. No respiratory distress. He has no wheezes. He has no rales. He exhibits no tenderness.  Abdominal: Soft. Bowel sounds are normal. He exhibits no distension and no mass. There is no tenderness. There is no rebound and no guarding.  Genitourinary: Rectum normal, prostate normal and penis normal. Rectal exam shows guaiac negative stool.  Musculoskeletal: Normal range of motion. He exhibits no edema or tenderness.  Lymphadenopathy:    He has no cervical adenopathy.  Neurological: He is alert and oriented to person, place, and time. He displays normal reflexes. He exhibits normal muscle tone.  Skin: Skin is warm and dry. No rash noted. He is not diaphoretic. No erythema. No pallor.  Psychiatric: He has a normal mood and affect. His behavior is normal. Judgment and thought content normal.  Nursing note and vitals reviewed.  BP (!) 138/98 (BP Location: Left Arm, Patient Position: Sitting, Cuff Size: Large)   Pulse (!) 55   Temp 98.4 F (36.9 C) (Oral)   Resp 16   Ht '5\' 10"'  (1.778 m)   Wt 243 lb 9.6 oz (110.5 kg)   SpO2 97%   BMI 34.95 kg/m  Wt Readings from Last 3 Encounters:  03/18/16 243 lb 9.6 oz (110.5 kg)  05/24/15 241 lb 3.2 oz (109.4 kg)  03/13/15 245 lb 12.8 oz (111.5 kg)     Lab Results  Component Value Date   WBC 8.8 03/18/2016   HGB 16.0 03/18/2016   HCT 46.4 03/18/2016   PLT 198.0 03/18/2016   GLUCOSE 123 (H) 03/18/2016   CHOL 152 03/18/2016   TRIG 164 (H) 03/18/2016   HDL 45 03/18/2016   LDLCALC 74 03/18/2016   ALT 27 03/18/2016   AST 27 03/18/2016   NA 140 03/18/2016   K 4.2 03/18/2016   CL 103 03/18/2016   CREATININE 0.87 03/18/2016   BUN 17 03/18/2016   CO2 25 03/18/2016   TSH 1.10 03/18/2016   PSA 1.5 03/18/2016   HGBA1C 4.8 11/25/2013   MICROALBUR <0.7 03/18/2016    No results found.   Assessment & Plan:  Plan  I have discontinued Mr. Berendt amoxicillin-clavulanate and doxycycline. I am also having him maintain his  aspirin, Flaxseed Oil, multivitamin, ibuprofen, NON FORMULARY, metaxalone, fluticasone, meloxicam, simvastatin, Niacin CR, and metoprolol succinate.  Meds ordered this encounter  Medications  . simvastatin (ZOCOR) 40 MG tablet    Sig: Take 1 tablet (40 mg total) by mouth at bedtime.    Dispense:  90 tablet    Refill:  3  . Niacin CR 1000 MG TBCR    Sig: Take 1 tablet (1,000 mg total) by mouth daily.    Dispense:  90 each    Refill:  3  . metoprolol succinate (TOPROL-XL) 100 MG 24 hr tablet    Sig: Take 1 tablet (100 mg total) by mouth daily.    Dispense:  90 tablet    Refill:  3    Problem List Items Addressed This Visit      Unprioritized   Essential hypertension    stable  Relevant Medications   simvastatin (ZOCOR) 40 MG tablet   metoprolol succinate (TOPROL-XL) 100 MG 24 hr tablet   Other Relevant Orders   POCT urinalysis dipstick   Microalbumin / creatinine urine ratio (Completed)   CBC with Differential/Platelet (Completed)   Comp Met (CMET) (Completed)   Preventative health care - Primary    ghm utd Check labs See avs       Relevant Orders   POCT urinalysis dipstick   Microalbumin / creatinine urine ratio (Completed)   CBC with Differential/Platelet (Completed)   PSA (Completed)   TSH (Completed)   Comp Met (CMET) (Completed)   Lipid panel (Completed)    Other Visit Diagnoses    Hyperlipidemia, unspecified hyperlipidemia type       Relevant Medications   simvastatin (ZOCOR) 40 MG tablet   Niacin CR 1000 MG TBCR   metoprolol succinate (TOPROL-XL) 100 MG 24 hr tablet   Other Relevant Orders   POCT urinalysis dipstick   Microalbumin / creatinine urine ratio (Completed)   CBC with Differential/Platelet (Completed)   Lipid panel (Completed)   Need for hepatitis C screening test       Relevant Orders   Hepatitis C antibody (Completed)   Encounter for immunization       Relevant Orders   Flu Vaccine QUAD 36+ mos IM (Completed)      Follow-up:  Return in about 6 months (around 09/15/2016) for hypertension, hyperlipidemia.  [name], DO

## 2016-03-18 NOTE — Patient Instructions (Signed)

## 2016-03-19 NOTE — Assessment & Plan Note (Signed)
stable °

## 2016-03-19 NOTE — Assessment & Plan Note (Addendum)
ghm utd Check labs  See avs  

## 2016-03-25 ENCOUNTER — Ambulatory Visit (INDEPENDENT_AMBULATORY_CARE_PROVIDER_SITE_OTHER): Payer: Managed Care, Other (non HMO) | Admitting: Medical

## 2016-03-25 ENCOUNTER — Telehealth: Payer: Self-pay | Admitting: Medical

## 2016-03-25 ENCOUNTER — Ambulatory Visit (HOSPITAL_BASED_OUTPATIENT_CLINIC_OR_DEPARTMENT_OTHER)
Admission: RE | Admit: 2016-03-25 | Discharge: 2016-03-25 | Disposition: A | Discharge status: Home / Self Care | Payer: Managed Care, Other (non HMO) | Source: Ambulatory Visit | Attending: Medical | Admitting: Medical

## 2016-03-25 ENCOUNTER — Encounter: Payer: Self-pay | Admitting: Medical

## 2016-03-25 VITALS — BP 128/80 | HR 58 | Temp 98.1°F | Ht 70.0 in | Wt 240.0 lb

## 2016-03-25 DIAGNOSIS — R109 Unspecified abdominal pain: Secondary | ICD-10-CM | POA: Diagnosis not present

## 2016-03-25 LAB — CBC WITH DIFFERENTIAL/PLATELET
BASOS ABS: 0 10*3/uL (ref 0.0–0.1)
Basophils Relative: 0.2 % (ref 0.0–3.0)
EOS ABS: 0.2 10*3/uL (ref 0.0–0.7)
Eosinophils Relative: 1.9 % (ref 0.0–5.0)
HCT: 42.7 % (ref 39.0–52.0)
Hemoglobin: 14.8 g/dL (ref 13.0–17.0)
LYMPHS ABS: 1.8 10*3/uL (ref 0.7–4.0)
Lymphocytes Relative: 16.3 % (ref 12.0–46.0)
MCHC: 34.8 g/dL (ref 30.0–36.0)
MCV: 93.8 fl (ref 78.0–100.0)
MONO ABS: 1 10*3/uL (ref 0.1–1.0)
MONOS PCT: 8.8 % (ref 3.0–12.0)
NEUTROS PCT: 72.8 % (ref 43.0–77.0)
Neutro Abs: 7.9 10*3/uL — ABNORMAL HIGH (ref 1.4–7.7)
Platelets: 210 10*3/uL (ref 150.0–400.0)
RBC: 4.55 Mil/uL (ref 4.22–5.81)
RDW: 12 % (ref 11.5–15.5)
WBC: 10.8 10*3/uL — ABNORMAL HIGH (ref 4.0–10.5)

## 2016-03-25 LAB — POCT URINALYSIS DIPSTICK
Bilirubin, UA: NEGATIVE
Blood, UA: NEGATIVE
GLUCOSE UA: NEGATIVE
KETONES UA: NEGATIVE
LEUKOCYTES UA: NEGATIVE
Nitrite, UA: NEGATIVE
Protein, UA: NEGATIVE
SPEC GRAV UA: 1.025
Urobilinogen, UA: NEGATIVE
pH, UA: 6.5

## 2016-03-25 LAB — COMPREHENSIVE METABOLIC PANEL
ALK PHOS: 52 U/L (ref 39–117)
ALT: 21 U/L (ref 0–53)
AST: 21 U/L (ref 0–37)
Albumin: 4.3 g/dL (ref 3.5–5.2)
BILIRUBIN TOTAL: 1 mg/dL (ref 0.2–1.2)
BUN: 10 mg/dL (ref 6–23)
CO2: 31 mEq/L (ref 19–32)
Calcium: 9.6 mg/dL (ref 8.4–10.5)
Chloride: 103 mEq/L (ref 96–112)
Creatinine, Ser: 0.73 mg/dL (ref 0.40–1.50)
GFR: 117.95 mL/min (ref >60.00)
GLUCOSE: 105 mg/dL — AB (ref 70–99)
Potassium: 4.2 mEq/L (ref 3.5–5.1)
SODIUM: 140 meq/L (ref 135–145)
TOTAL PROTEIN: 7.4 g/dL (ref 6.0–8.3)

## 2016-03-25 LAB — AMYLASE: Amylase: 18 U/L — ABNORMAL LOW (ref 27–131)

## 2016-03-25 LAB — LIPASE: Lipase: 19 U/L (ref 11.0–59.0)

## 2016-03-25 MED ORDER — RANITIDINE HCL 150 MG PO CAPS: 150.0000 mg | ORAL_CAPSULE | Freq: Two times a day (BID) | ORAL | 0 refills | Status: DC

## 2016-03-25 NOTE — Progress Notes (Signed)
Subjective:    Patient ID: Raymond Howell, male    DOB: Apr 14, 1961, 55 y.o.   MRN: [id number]  HPI  Pt states recently felt fine.  Pt states past Thursday had very greasy meal. Pt later in day had carbonated beverage. His stomach felt bloated like he needed to pass gas or stool Later that day he felt chills and fever.   Took tylenol that night.   After eating would feel pain over weekend.   Over weekend his stools felt harder and less frequent. Today he states stool feel more normal and went couple of times today. But still feels bloated. Pt states feels less discomfort today compared to the other day.  But still not back to normal.  Pt states pain seems more rt upper quadrant. History of some ruq region pain and some studies done. Some fat in liver US 2003.   No left lower quadrant pain. Pt had appendectomy.   Today he is feeling some better compared to on Thursday.   Pt had very rare reflux before cpap use.   Last time he ate was 8 am.   Pt up to date on his colonoscopy. Done in 2015.  Review of Systems  Constitutional: Negative for chills, fatigue and fever.       Non presently but see hpi. Early on some symptoms.  Respiratory: Negative for cough, chest tightness and shortness of breath.   Cardiovascular: Negative for chest pain and palpitations.  Gastrointestinal: Positive for abdominal distention and abdominal pain. Negative for blood in stool, diarrhea, nausea and rectal pain.       See hpi.  Musculoskeletal: Negative for back pain and myalgias.  Skin: Negative for rash.  Neurological: Negative for dizziness and headaches.  Hematological: Negative for adenopathy. Does not bruise/bleed easily.  Psychiatric/Behavioral: Negative for behavioral problems and confusion.   Past Medical History:  Diagnosis Date  . Angiodysplasia of colon   . Basal cell carcinoma of nose   . Chest pain   . Family history of heart disease   . Hyperlipidemia   . Hypertension   . Sleep apnea     CPAP  . Umbilical hernia   . Venereal wart      Social History   Social History  . Marital status: Married    Spouse name: N/A  . Number of children: N/A  . Years of education: N/A   Occupational History  . Not on file.   Social History Main Topics  . Smoking status: Never Smoker  . Smokeless tobacco: Never Used  . Alcohol use 1.5 - 2.0 oz/week    3 - 4 drink(s) per week     Comment: per week  . Drug use: No  . Sexual activity: Yes    Partners: Female   Other Topics Concern  . Not on file   Social History Narrative   Reg exercise--- walking the dogs    Past Surgical History:  Procedure Laterality Date  . APPENDECTOMY  1987  . COLONOSCOPY    . HERNIA REPAIR  [id number]   umbilical  . MOHS SURGERY     nose  . MOUTH SURGERY     wisdom teeth    Family History  Problem Relation Age of Onset  . Heart disease Father 75    MI died @ 45  . Coronary artery disease Other     1st degree relative  . Cancer Other     skin  . Heart attack Maternal Grandfather 42  .  Colon cancer Neg Hx   . Stomach cancer Neg Hx     No Known Allergies  Current Outpatient Prescriptions on File Prior to Visit  Medication Sig Dispense Refill  . aspirin 81 MG tablet Take 81 mg by mouth daily.      . Flaxseed, Linseed, (FLAXSEED OIL) 1000 MG CAPS Take by mouth 2 (two) times daily.      . fluticasone (FLONASE) 50 MCG/ACT nasal spray Place 2 sprays into both nostrils daily. 16 g 6  . ibuprofen (ADVIL,MOTRIN) 200 MG tablet Take 800 mg by mouth every 6 (six) hours as needed for pain.    . meloxicam (MOBIC) 15 MG tablet Take 1 tablet (15 mg total) by mouth daily. 90 tablet 1  . metaxalone (SKELAXIN) 800 MG tablet Take 800 mg by mouth 4 (four) times daily as needed.    . metoprolol succinate (TOPROL-XL) 100 MG 24 hr tablet Take 1 tablet (100 mg total) by mouth daily. 90 tablet 3  . Multiple Vitamin (MULTIVITAMIN) tablet Take 1 tablet by mouth daily.      . Niacin CR 1000 MG TBCR Take 1 tablet  (1,000 mg total) by mouth daily. 90 each 3  . NON FORMULARY at bedtime. CPAP    . simvastatin (ZOCOR) 40 MG tablet Take 1 tablet (40 mg total) by mouth at bedtime. 90 tablet 3   No current facility-administered medications on file prior to visit.     BP 128/80 (BP Location: Right Arm, Patient Position: Sitting, Cuff Size: Normal)   Pulse (!) 58   Temp 98.1 F (36.7 C) (Oral)   Ht 5\' 10"  (1.778 m)   Wt 240 lb (108.9 kg)   SpO2 98%   BMI 34.44 kg/m       Objective:   Physical Exam   General Appearance- Not in acute distress.  HEENT Eyes- Scleraeral/Conjuntiva-bilat- Not Yellow. Mouth & Throat- Normal.  Chest and Lung Exam Auscultation: Breath sounds:-Normal. Adventitious sounds:- No Adventitious sounds.  Cardiovascular Auscultation:Rythm - Regular. Heart Sounds -Normal heart sounds.  Abdomen Inspection:-Inspection Normal.  Palpation/Perucssion: Palpation and Percussion of the abdomen reveal-Ventral hernia(appears on straight leg lift then reduces when abd pressure decreased). No hernia at umblical site. But large scar. Nor rlq pain. Pain about 6 cm to rt of  umblical site and same level. Faint rt upper quadrant pain. No left lower quadrant pain. , No Rebound tenderness, No rigidity(Guarding) and No Palpable abdominal masses.  Liver:-Normal.  Spleen:- Normal.   Back- no cva tenderness.      Assessment & Plan:  For your abdomen pain will get stat labs and abd [place] today.  Please eat very heatlhy. Avoid fried/fatty foods and carbonated beverages especially.  Rx ranitidine.   Please get labs now and go downstairs to schedule [place]. I think will be late this afternoon.  If studies all negative and symptoms persist then consider may need ct abd/pelvis.  If any point severe pain after hours then ED evaluation.  Follow up Friday or as needed

## 2016-03-25 NOTE — Progress Notes (Signed)
Pre visit review using our clinic review tool, if applicable. No additional management support is needed unless otherwise documented below in the visit note./hsm  

## 2016-03-25 NOTE — Patient Instructions (Signed)
For your abdomen pain will get stat labs and abd [place] today.  Please eat very heatlhy. Avoid fried/fatty foods and carbonated beverages especially.  Rx ranitidine.   Please get labs now and go downstairs to schedule [place]. I think will be late this afternoon.  If studies all negative and symptoms persist then consider may need ct abd/pelvis.  If any point severe pain after hours then ED evaluation.  Follow up Friday or as needed

## 2016-03-25 NOTE — Telephone Encounter (Signed)
I talked with pt. He is doing well. With no pain at time I talked with him. Will follow up on Friday if any residual pain. If he improve by Thursday will cancel appointment. Did advise pt if he does have future flare with greasy or fried food as last week then could refer to GI for further work up. Pt expressed understanding.

## 2016-03-26 LAB — H. PYLORI BREATH TEST: H. PYLORI BREATH TEST: NOT DETECTED

## 2016-03-27 ENCOUNTER — Telehealth: Payer: Self-pay

## 2016-03-27 NOTE — Telephone Encounter (Signed)
Spoke with pt he stated that he is feeling a whole lot better. Pt stated he will continue the regimen by Raymond Howell. Pt stated he will cancel his follow up appt and will call back with any other issues if they come up. PC

## 2016-03-28 ENCOUNTER — Ambulatory Visit: Payer: Managed Care, Other (non HMO) | Admitting: Medical

## 2016-03-28 NOTE — Progress Notes (Signed)
Pt has seen results on MyChart and message also sent for patient to call back if any questions.

## 2016-04-05 ENCOUNTER — Telehealth: Payer: Self-pay | Admitting: Medical

## 2016-04-05 NOTE — Telephone Encounter (Signed)
Canceling pt ifob test. But if he brings test in reorder and associate with abd pain.

## 2016-04-25 ENCOUNTER — Encounter: Payer: Self-pay | Admitting: Family Medicine

## 2016-04-28 MED ORDER — METAXALONE 800 MG PO TABS: 800.0000 mg | ORAL_TABLET | Freq: Four times a day (QID) | ORAL | 0 refills | Status: DC | PRN

## 2016-04-28 NOTE — Telephone Encounter (Signed)
My back is acting up this week and feels like it is going to spasm if I move the wrong way. All my flexeril is over 56 years old. Can I get a new prescription called to help relax my back muscles. Thanks  Patient last OV 03/28/16: patient's chart Hx indicates that pt was px Hydrocodone in 09/2011 [facial dental pain that ended in May 2013 as well; then Skelexin 800mg  QID prn in 01/2013 and last px 02/2013], along with Mobic 15 mg QD, which are still showing as current medications. I cannot find any history of patient being px Cyclobenzaprine.  Please Advise on refills in PCP absence/SLS 12/18

## 2016-06-03 ENCOUNTER — Encounter: Payer: Self-pay | Admitting: Family Medicine

## 2016-12-01 ENCOUNTER — Ambulatory Visit (INDEPENDENT_AMBULATORY_CARE_PROVIDER_SITE_OTHER): Payer: Managed Care, Other (non HMO) | Admitting: Pulmonary Disease

## 2016-12-01 ENCOUNTER — Encounter: Payer: Self-pay | Admitting: Pulmonary Disease

## 2016-12-01 VITALS — BP 116/78 | HR 49 | Ht 70.0 in | Wt 247.0 lb

## 2016-12-01 DIAGNOSIS — E669 Obesity, unspecified: Secondary | ICD-10-CM

## 2016-12-01 DIAGNOSIS — G4733 Obstructive sleep apnea (adult) (pediatric): Secondary | ICD-10-CM | POA: Diagnosis not present

## 2016-12-01 NOTE — Assessment & Plan Note (Signed)
Weight loss encouraged 

## 2016-12-01 NOTE — Assessment & Plan Note (Signed)
Change to auto CPAP 12-16 cm and check download in one month  CPAP supplies will be renewed for a year  Weight loss encouraged, compliance with goal of at least 4-6 hrs every night is the expectation. Advised against medications with sedative side effects Cautioned against driving when sleepy - understanding that sleepiness will vary on a day to day basis

## 2016-12-01 NOTE — Patient Instructions (Signed)
Change to auto CPAP 12-16 cm and check download in one month  CPAP supplies will be renewed for a year

## 2016-12-01 NOTE — Progress Notes (Signed)
   Subjective:    Patient ID: Raymond Howell, male    DOB: 1961-02-14, 56 y.o.   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   He was last seen in 2016. He has been maintained on CPAP since 2011 initially at 9 cm this has been gradually increased to 12 cm in 2016 and then to 14 cm. Wife has still noted snoring at this pressure, He feels rested when he wakes up otherwise. 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 Download shows residue AHI about 6/hour on average. Up to about 10/hour on occasion with excellent compliance and minimal leak  His prior downloads were reviewed and they have showed a few residual AHI about 5-10/ He has gained about 10 pounds to his current weight of 247  Significant tests/ events reviewed  PSG  2004 (205 lbs )- AHI was 12/ , did not need treatment.  04/2010 PSG (240 lbs ) >> AHI 22/h , lowest desatn 87%, PLMs disappeared with PAP. Central apneas emerged at 11 cm & persisted on BiPAP 13/9.     07/2014 high residuals >> Increased pr to 12 cm    Review of Systems neg for any significant sore throat, dysphagia, itching, sneezing, nasal congestion or excess/ purulent secretions, fever, chills, sweats, unintended wt loss, pleuritic or exertional cp, hempoptysis, orthopnea pnd or change in chronic leg swelling. Also denies presyncope, palpitations, heartburn, abdominal pain, nausea, vomiting, diarrhea or change in bowel or urinary habits, dysuria,hematuria, rash, arthralgias, visual complaints, headache, numbness weakness or ataxia.     Objective:   Physical Exam   Gen. Pleasant, obese, in no distress ENT - no lesions, no post nasal drip Neck: No JVD, no thyromegaly, no carotid bruits Lungs: no use of accessory muscles, no dullness to percussion, decreased without rales or rhonchi  Cardiovascular: Rhythm regular, heart sounds  normal, no murmurs or gallops, no peripheral  edema Musculoskeletal: No deformities, no cyanosis or clubbing , no tremors        Assessment & Plan:

## 2017-02-19 ENCOUNTER — Encounter: Payer: Self-pay | Admitting: Family Medicine

## 2017-02-19 ENCOUNTER — Ambulatory Visit (INDEPENDENT_AMBULATORY_CARE_PROVIDER_SITE_OTHER): Payer: Managed Care, Other (non HMO) | Admitting: Family Medicine

## 2017-02-19 VITALS — BP 136/84 | HR 54 | Temp 98.1°F | Resp 16 | Ht 70.0 in | Wt 248.0 lb

## 2017-02-19 DIAGNOSIS — M67471 Ganglion, right ankle and foot: Secondary | ICD-10-CM

## 2017-02-19 DIAGNOSIS — Z23 Encounter for immunization: Secondary | ICD-10-CM | POA: Diagnosis not present

## 2017-02-19 NOTE — Progress Notes (Signed)
Patient ID: Raymond Howell, male    DOB: 1960/06/12  Age: 56 y.o. MRN: [id number]    Subjective:  Subjective  HPI T Raymond Howell presents for cyst on top of R foot.  He first noticed it when his grandson stepped his foot   He recently noticed it was getting bigger.  It is not painful.   Review of Systems  Constitutional: Negative for appetite change, diaphoresis, fatigue and unexpected weight change.  Eyes: Negative for pain, redness and visual disturbance.  Respiratory: Negative for cough, chest tightness, shortness of breath and wheezing.   Cardiovascular: Negative for chest pain, palpitations and leg swelling.  Endocrine: Negative for cold intolerance, heat intolerance, polydipsia, polyphagia and polyuria.  Genitourinary: Negative for difficulty urinating, dysuria and frequency.  Neurological: Negative for dizziness, light-headedness, numbness and headaches.    History Past Medical History:  Diagnosis Date  . Angiodysplasia of colon   . Basal cell carcinoma of nose   . Chest pain   . Family history of heart disease   . Hyperlipidemia   . Hypertension   . Sleep apnea    CPAP  . Umbilical hernia   . Venereal wart     He has a past surgical history that includes Hernia repair (2004); Mohs surgery; Mouth surgery; Appendectomy (1987); and Colonoscopy.   His family history includes Cancer in his other; Coronary artery disease in his other; Heart attack (age of onset: 12) in his maternal grandfather; Heart disease (age of onset: 37) in his father.He reports that he has never smoked. He has never used smokeless tobacco. He reports that he drinks about 1.5 - 2.0 oz of alcohol per week . He reports that he does not use drugs.  Current Outpatient Prescriptions on File Prior to Visit  Medication Sig Dispense Refill  . aspirin 81 MG tablet Take 81 mg by mouth daily.      . Flaxseed, Linseed, (FLAXSEED OIL) 1000 MG CAPS Take by mouth 2 (two) times daily.      . fluticasone (FLONASE) 50 MCG/ACT  nasal spray Place 2 sprays into both nostrils daily. 16 g 6  . ibuprofen (ADVIL,MOTRIN) 200 MG tablet Take 800 mg by mouth every 6 (six) hours as needed for pain.    . meloxicam (MOBIC) 15 MG tablet Take 1 tablet (15 mg total) by mouth daily. 90 tablet 1  . metaxalone (SKELAXIN) 800 MG tablet Take 1 tablet (800 mg total) by mouth 4 (four) times daily as needed. 20 tablet 0  . metoprolol succinate (TOPROL-XL) 100 MG 24 hr tablet Take 1 tablet (100 mg total) by mouth daily. 90 tablet 3  . Multiple Vitamin (MULTIVITAMIN) tablet Take 1 tablet by mouth daily.      . Niacin CR 1000 MG TBCR Take 1 tablet (1,000 mg total) by mouth daily. 90 each 3  . NON FORMULARY at bedtime. CPAP    . ranitidine (ZANTAC) 150 MG capsule Take 1 capsule (150 mg total) by mouth 2 (two) times daily. 60 capsule 0  . simvastatin (ZOCOR) 40 MG tablet Take 1 tablet (40 mg total) by mouth at bedtime. 90 tablet 3   No current facility-administered medications on file prior to visit.      Objective:  Objective  Physical Exam  Constitutional: He is oriented to person, place, and time. Vital signs are normal. He appears well-developed and well-nourished. He is sleeping.  HENT:  Head: Normocephalic and atraumatic.  Mouth/Throat: Oropharynx is clear and moist.  Eyes: Pupils are equal,  round, and reactive to light. EOM are normal.  Neck: Normal range of motion. Neck supple. No thyromegaly present.  Cardiovascular: Normal rate and regular rhythm.   No murmur heard. Pulmonary/Chest: Effort normal and breath sounds normal. No respiratory distress. He has no wheezes. He has no rales. He exhibits no tenderness.  Musculoskeletal: He exhibits no edema or tenderness.       Right foot: There is swelling.       Feet:  Neurological: He is alert and oriented to person, place, and time.  Skin: Skin is warm and dry.  Psychiatric: He has a normal mood and affect. His behavior is normal. Judgment and thought content normal.   BP 136/84  (BP Location: Left Arm, Patient Position: Sitting, Cuff Size: Large)   Pulse (!) 54   Temp 98.1 F (36.7 C) (Oral)   Resp 16   Ht 5\' 10"  (1.778 m)   Wt 248 lb (112.5 kg)   SpO2 97%   BMI 35.58 kg/m  Wt Readings from Last 3 Encounters:  02/19/17 248 lb (112.5 kg)  12/01/16 247 lb (112 kg)  03/25/16 240 lb (108.9 kg)     Lab Results  Component Value Date   WBC 10.8 (H) 03/25/2016   HGB 14.8 03/25/2016   HCT 42.7 03/25/2016   PLT 210.0 03/25/2016   GLUCOSE 105 (H) 03/25/2016   CHOL 152 03/18/2016   TRIG 164 (H) 03/18/2016   HDL 45 03/18/2016   LDLCALC 74 03/18/2016   ALT 21 03/25/2016   AST 21 03/25/2016   NA 140 03/25/2016   K 4.2 03/25/2016   CL 103 03/25/2016   CREATININE 0.73 03/25/2016   BUN 10 03/25/2016   CO2 31 03/25/2016   TSH 1.10 03/18/2016   PSA 1.5 03/18/2016   HGBA1C 4.8 11/25/2013   MICROALBUR <0.7 03/18/2016    US Abdomen Complete  Result Date: 03/25/2016 CLINICAL DATA:  Abdominal pain and decreased appetite for 1 week EXAM: ABDOMEN ULTRASOUND COMPLETE COMPARISON:  None. FINDINGS: Gallbladder: No gallstones or wall thickening visualized. There is no pericholecystic fluid. No sonographic Murphy sign noted by sonographer. Common bile duct: Diameter: 4 mm. There is no intrahepatic, common hepatic, or common bile duct dilatation. Liver: No focal lesion identified. Within normal limits in parenchymal echogenicity. IVC: No abnormality visualized. Pancreas: No mass or inflammatory focus. Spleen: Size and appearance within normal limits. Right Kidney: Length: 11.9 cm. Echogenicity within normal limits. No mass or hydronephrosis visualized. Left Kidney: Length: 12.2 cm. Echogenicity within normal limits. No mass or hydronephrosis visualized. Abdominal aorta: No aneurysm visualized. Other findings: No demonstrable ascites. IMPRESSION: Study within normal limits. Electronically Signed   By: Raymond Howell III M.D.   On: 03/25/2016 18:50     Assessment & Plan:    Plan  I am having Mr. Soy maintain his aspirin, Flaxseed Oil, multivitamin, ibuprofen, NON FORMULARY, fluticasone, meloxicam, simvastatin, Niacin CR, metoprolol succinate, ranitidine, and metaxalone.  No orders of the defined types were placed in this encounter.   Problem List Items Addressed This Visit    None    Visit Diagnoses    Ganglion cyst of right foot    -  Primary   Relevant Orders   Ambulatory referral to Sports Medicine   Need for immunization against influenza       Relevant Orders   Flu Vaccine QUAD 36+ mos IM (Completed)      Follow-up: Return if symptoms worsen or fail to improve.  [name], DO

## 2017-02-19 NOTE — Patient Instructions (Signed)
Ganglion Cyst A ganglion cyst is a noncancerous, fluid-filled lump that occurs near joints or tendons. The ganglion cyst grows out of a joint or the lining of a tendon. It most often develops in the hand or wrist, but it can also develop in the shoulder, elbow, hip, knee, ankle, or foot. The round or oval ganglion cyst can be the size of a pea or larger than a grape. Increased activity may enlarge the size of the cyst because more fluid starts to build up. What are the causes? It is not known what causes a ganglion cyst to grow. However, it may be related to:  Inflammation or irritation around the joint.  An injury.  Repetitive movements or overuse.  Arthritis.  What increases the risk? Risk factors include:  Being a woman.  Being age 20-50.  What are the signs or symptoms? Symptoms may include:  A lump. This most often appears on the hand or wrist, but it can occur in other areas of the body.  Tingling.  Pain.  Numbness.  Muscle weakness.  Weak grip.  Less movement in a joint.  How is this diagnosed? Ganglion cysts are most often diagnosed based on a physical exam. Your health care provider will feel the lump and may shine a light alongside it. If it is a ganglion cyst, a light often shines through it. Your health care provider may order an X-ray, ultrasound, or MRI to rule out other conditions. How is this treated? Ganglion cysts usually go away on their own without treatment. If pain or other symptoms are involved, treatment may be needed. Treatment is also needed if the ganglion cyst limits your movement or if it gets infected. Treatment may include:  Wearing a brace or splint on your wrist or finger.  Taking anti-inflammatory medicine.  Draining fluid from the lump with a needle (aspiration).  Injecting a steroid into the joint.  Surgery to remove the ganglion cyst.  Follow these instructions at home:  Do not press on the ganglion cyst, poke it with a  needle, or hit it.  Take medicines only as directed by your health care provider.  Wear your brace or splint as directed by your health care provider.  Watch your ganglion cyst for any changes.  Keep all follow-up visits as directed by your health care provider. This is important. Contact a health care provider if:  Your ganglion cyst becomes larger or more painful.  You have increased redness, red streaks, or swelling.  You have pus coming from the lump.  You have weakness or numbness in the affected area.  You have a fever or chills. This information is not intended to replace advice given to you by your health care provider. Make sure you discuss any questions you have with your health care provider. Document Released: 04/25/2000 Document Revised: 10/04/2015 Document Reviewed: 10/11/2013 Elsevier Interactive Patient Education  2018 [employer]  

## 2017-02-24 ENCOUNTER — Ambulatory Visit (HOSPITAL_BASED_OUTPATIENT_CLINIC_OR_DEPARTMENT_OTHER)
Admission: RE | Admit: 2017-02-24 | Discharge: 2017-02-24 | Disposition: A | Discharge status: Home / Self Care | Payer: Managed Care, Other (non HMO) | Source: Ambulatory Visit | Attending: Family Medicine | Admitting: Family Medicine

## 2017-02-24 ENCOUNTER — Encounter: Payer: Self-pay | Admitting: Family Medicine

## 2017-02-24 ENCOUNTER — Ambulatory Visit (INDEPENDENT_AMBULATORY_CARE_PROVIDER_SITE_OTHER): Payer: Managed Care, Other (non HMO) | Admitting: Family Medicine

## 2017-02-24 VITALS — BP 148/88 | HR 57 | Ht 69.0 in | Wt 240.0 lb

## 2017-02-24 DIAGNOSIS — M79671 Pain in right foot: Secondary | ICD-10-CM | POA: Diagnosis not present

## 2017-02-24 DIAGNOSIS — M67471 Ganglion, right ankle and foot: Secondary | ICD-10-CM

## 2017-02-24 NOTE — Patient Instructions (Signed)
You have a synovial cyst of your dorsal foot. Your ultrasound is reassuring - the x-rays do not show a foreign body in the middle of this meaning the bright area on the ultrasound in the cyst was a wall. Consider compression (arch binder). Otherwise unless this causes pain (unlikely) I would leave this alone - a majority of these resolve on their own.

## 2017-03-03 DIAGNOSIS — M67471 Ganglion, right ankle and foot: Secondary | ICD-10-CM | POA: Insufficient documentation

## 2017-03-03 NOTE — Assessment & Plan Note (Signed)
independently performed and reviewed ultrasound, ordered and reviewed radiographs - no evidence foreign body on radiographs - consistent with septation in ganglion cyst.  I reassured patient - no evidence of neovascularity or solid component to suggest malignancy.  Consider aspiration/injection if this worsens or becomes symptomatic.  Compression with arch binder.  Reassured.  F/u prn.

## 2017-03-03 NOTE — Progress Notes (Signed)
PCP and consultation requested by: [name], DO  Subjective:   HPI: Patient is a 56 y.o. male here for right foot swelling.  Patient reports he noticed a knot on top of his right foot about 3-4 months ago that's gotten larger in that time. No associated pain with this or discomfort. No fevers, redness, rash. No skin changes. Has not tried anything for treatment.  Past Medical History:  Diagnosis Date  . Angiodysplasia of colon   . Basal cell carcinoma of nose   . Chest pain   . Family history of heart disease   . Hyperlipidemia   . Hypertension   . Sleep apnea    CPAP  . Umbilical hernia   . Venereal wart     Current Outpatient Prescriptions on File Prior to Visit  Medication Sig Dispense Refill  . aspirin 81 MG tablet Take 81 mg by mouth daily.      . Flaxseed, Linseed, (FLAXSEED OIL) 1000 MG CAPS Take by mouth 2 (two) times daily.      . fluticasone (FLONASE) 50 MCG/ACT nasal spray Place 2 sprays into both nostrils daily. 16 g 6  . ibuprofen (ADVIL,MOTRIN) 200 MG tablet Take 800 mg by mouth every 6 (six) hours as needed for pain.    . meloxicam (MOBIC) 15 MG tablet Take 1 tablet (15 mg total) by mouth daily. 90 tablet 1  . metaxalone (SKELAXIN) 800 MG tablet Take 1 tablet (800 mg total) by mouth 4 (four) times daily as needed. 20 tablet 0  . metoprolol succinate (TOPROL-XL) 100 MG 24 hr tablet Take 1 tablet (100 mg total) by mouth daily. 90 tablet 3  . Multiple Vitamin (MULTIVITAMIN) tablet Take 1 tablet by mouth daily.      . Niacin CR 1000 MG TBCR Take 1 tablet (1,000 mg total) by mouth daily. 90 each 3  . NON FORMULARY at bedtime. CPAP    . ranitidine (ZANTAC) 150 MG capsule Take 1 capsule (150 mg total) by mouth 2 (two) times daily. 60 capsule 0  . simvastatin (ZOCOR) 40 MG tablet Take 1 tablet (40 mg total) by mouth at bedtime. 90 tablet 3   No current facility-administered medications on file prior to visit.     Past Surgical History:  Procedure  Laterality Date  . APPENDECTOMY  1987  . COLONOSCOPY    . HERNIA REPAIR  6720   umbilical  . MOHS SURGERY     nose  . MOUTH SURGERY     wisdom teeth    No Known Allergies  Social History   Social History  . Marital status: Married    Spouse name: N/A  . Number of children: N/A  . Years of education: N/A   Occupational History  . Not on file.   Social History Main Topics  . Smoking status: Never Smoker  . Smokeless tobacco: Never Used  . Alcohol use 1.5 - 2.0 oz/week    3 - 4 drink(s) per week     Comment: per week  . Drug use: No  . Sexual activity: Yes    Partners: Female   Other Topics Concern  . Not on file   Social History Narrative   Reg exercise--- walking the dogs    Family History  Problem Relation Age of Onset  . Heart disease Father 53       MI died @ 60  . Coronary artery disease Other        1st degree relative  .  Cancer Other        skin  . Heart attack Maternal Grandfather 42  . Colon cancer Neg Hx   . Stomach cancer Neg Hx     BP (!) 148/88   Pulse (!) 57   Ht 5\' 9"  (1.753 m)   Wt 240 lb (108.9 kg)   BMI 35.44 kg/m   Review of Systems: See HPI above.     Objective:  Physical Exam:  Gen: NAD, comfortable in exam room  Right foot/ankle: Well circumscribed mobile mass midfoot dorsally.  No other gross deformity, swelling, ecchymoses.  No erythema, subluxations. FROM ankle and digits without pain. No TTP Negative ant drawer and talar tilt.   Negative syndesmotic compression. Negative metatarsal squeeze. Thompsons test negative. NV intact distally.  Left foot/ankle: FROM without pain.  MSK u/s right foot:  Approximately 1.5x1.0cm ganglion cyst dorsal foot with communication into joint between cuneiforms.  Hyperechoic line in center of this consistent with possible foreign body vs septation.   Assessment & Plan:  1. Right foot ganglion cyst - independently performed and reviewed ultrasound, ordered and reviewed radiographs  - no evidence foreign body on radiographs - consistent with septation in ganglion cyst.  I reassured patient - no evidence of neovascularity or solid component to suggest malignancy.  Consider aspiration/injection if this worsens or becomes symptomatic.  Compression with arch binder.  Reassured.  F/u prn.

## 2017-03-20 ENCOUNTER — Encounter: Payer: Managed Care, Other (non HMO) | Admitting: Family Medicine

## 2017-04-17 ENCOUNTER — Encounter: Payer: Self-pay | Admitting: Family Medicine

## 2017-04-17 ENCOUNTER — Ambulatory Visit (INDEPENDENT_AMBULATORY_CARE_PROVIDER_SITE_OTHER): Payer: Managed Care, Other (non HMO) | Admitting: Family Medicine

## 2017-04-17 VITALS — BP 120/76 | HR 51 | Temp 98.1°F | Resp 16 | Ht 69.0 in | Wt 246.8 lb

## 2017-04-17 DIAGNOSIS — Z0000 Encounter for general adult medical examination without abnormal findings: Secondary | ICD-10-CM | POA: Diagnosis not present

## 2017-04-17 DIAGNOSIS — E785 Hyperlipidemia, unspecified: Secondary | ICD-10-CM

## 2017-04-17 DIAGNOSIS — I10 Essential (primary) hypertension: Secondary | ICD-10-CM | POA: Diagnosis not present

## 2017-04-17 DIAGNOSIS — Z23 Encounter for immunization: Secondary | ICD-10-CM

## 2017-04-17 LAB — CBC WITH DIFFERENTIAL/PLATELET
BASOS PCT: 0.7 % (ref 0.0–3.0)
Basophils Absolute: 0.1 10*3/uL (ref 0.0–0.1)
EOS PCT: 1.9 % (ref 0.0–5.0)
Eosinophils Absolute: 0.2 10*3/uL (ref 0.0–0.7)
HCT: 47.2 % (ref 39.0–52.0)
Hemoglobin: 16.5 g/dL (ref 13.0–17.0)
LYMPHS ABS: 2 10*3/uL (ref 0.7–4.0)
Lymphocytes Relative: 24.3 % (ref 12.0–46.0)
MCHC: 34.8 g/dL (ref 30.0–36.0)
MCV: 93.9 fl (ref 78.0–100.0)
MONO ABS: 0.5 10*3/uL (ref 0.1–1.0)
Monocytes Relative: 6 % (ref 3.0–12.0)
NEUTROS PCT: 67.1 % (ref 43.0–77.0)
Neutro Abs: 5.5 10*3/uL (ref 1.4–7.7)
Platelets: 278 10*3/uL (ref 150.0–400.0)
RBC: 5.03 Mil/uL (ref 4.22–5.81)
RDW: 12.1 % (ref 11.5–15.5)
WBC: 8.1 10*3/uL (ref 4.0–10.5)

## 2017-04-17 LAB — POC URINALSYSI DIPSTICK (AUTOMATED)
BILIRUBIN UA: NEGATIVE
Blood, UA: NEGATIVE
GLUCOSE UA: NEGATIVE
KETONES UA: NEGATIVE
Leukocytes, UA: NEGATIVE
Nitrite, UA: NEGATIVE
Protein, UA: NEGATIVE
SPEC GRAV UA: 1.02 (ref 1.010–1.025)
Urobilinogen, UA: 0.2 E.U./dL
pH, UA: 6 (ref 5.0–8.0)

## 2017-04-17 LAB — LIPID PANEL
CHOLESTEROL: 136 mg/dL (ref 0–200)
HDL: 49.8 mg/dL (ref >39.00)
LDL CALC: 61 mg/dL (ref 0–99)
NonHDL: 85.98
Total CHOL/HDL Ratio: 3
Triglycerides: 125 mg/dL (ref 0.0–149.0)
VLDL: 25 mg/dL (ref 0.0–40.0)

## 2017-04-17 LAB — COMPREHENSIVE METABOLIC PANEL
ALBUMIN: 5.3 g/dL — AB (ref 3.5–5.2)
ALT: 69 U/L — ABNORMAL HIGH (ref 0–53)
AST: 51 U/L — ABNORMAL HIGH (ref 0–37)
Alkaline Phosphatase: 67 U/L (ref 39–117)
BUN: 16 mg/dL (ref 6–23)
CHLORIDE: 101 meq/L (ref 96–112)
CO2: 27 mEq/L (ref 19–32)
Calcium: 9.9 mg/dL (ref 8.4–10.5)
Creatinine, Ser: 0.81 mg/dL (ref 0.40–1.50)
GFR: 104.21 mL/min (ref >60.00)
Glucose, Bld: 105 mg/dL — ABNORMAL HIGH (ref 70–99)
POTASSIUM: 3.6 meq/L (ref 3.5–5.1)
SODIUM: 140 meq/L (ref 135–145)
Total Bilirubin: 0.7 mg/dL (ref 0.2–1.2)
Total Protein: 8.4 g/dL — ABNORMAL HIGH (ref 6.0–8.3)

## 2017-04-17 LAB — TSH: TSH: 1.95 u[IU]/mL (ref 0.35–4.50)

## 2017-04-17 MED ORDER — METOPROLOL SUCCINATE ER 100 MG PO TB24: 100.0000 mg | ORAL_TABLET | Freq: Every day | ORAL | 1 refills | Status: DC

## 2017-04-17 MED ORDER — SIMVASTATIN 40 MG PO TABS: 40.0000 mg | ORAL_TABLET | Freq: Every day | ORAL | 1 refills | Status: DC

## 2017-04-17 MED ORDER — NIACIN ER 1000 MG PO TBCR: 1.0000 | EXTENDED_RELEASE_TABLET | Freq: Every day | ORAL | 1 refills | Status: DC

## 2017-04-17 NOTE — Progress Notes (Signed)
Patient ID: Raymond Howell, male    DOB: [birth year]  Age: 56 y.o. MRN: [id number]    Subjective:  Subjective  HPI Raymond Howell presents for cpe.  No complaints.   Review of Systems  Constitutional: Negative.  Negative for chills and fever.  HENT: Negative for congestion, ear pain, hearing loss, nosebleeds, postnasal drip, rhinorrhea, sinus pressure, sneezing and tinnitus.   Eyes: Negative for photophobia, discharge, itching and visual disturbance.  Respiratory: Negative.  Negative for cough and shortness of breath.   Cardiovascular: Negative.  Negative for chest pain, palpitations and leg swelling.  Gastrointestinal: Negative for abdominal distention, abdominal pain, anal bleeding, blood in stool, constipation, diarrhea, nausea and vomiting.  Endocrine: Negative.   Genitourinary: Negative.  Negative for dysuria, frequency, hematuria and urgency.  Musculoskeletal: Negative.  Negative for back pain and myalgias.  Skin: Negative.  Negative for rash.  Allergic/Immunologic: Negative.  Negative for environmental allergies.  Neurological: Negative for dizziness, weakness, light-headedness, numbness and headaches.  Hematological: Does not bruise/bleed easily.  Psychiatric/Behavioral: Negative for agitation, confusion, decreased concentration, dysphoric mood, sleep disturbance and suicidal ideas. The patient is not nervous/anxious.     History Past Medical History:  Diagnosis Date  . Angiodysplasia of colon   . Basal cell carcinoma of nose   . Chest pain   . Family history of heart disease   . Hyperlipidemia   . Hypertension   . Sleep apnea    CPAP  . Umbilical hernia   . Venereal wart     Raymond Howell has a past surgical history that includes Hernia repair (2004); Mohs surgery; Mouth surgery; Appendectomy (1987); Colonoscopy; and Tooth extraction.   His family history includes Cancer in his other; Coronary artery disease in his other; Heart attack (age of onset: 17) in his maternal  grandfather; Heart disease (age of onset: 6) in his father.Raymond Howell reports that  has never smoked. Raymond Howell has never used smokeless tobacco. Raymond Howell reports that Raymond Howell drinks about 1.5 - 2.0 oz of alcohol per week. Raymond Howell reports that Raymond Howell does not use drugs.  Current Outpatient Medications on File Prior to Visit  Medication Sig Dispense Refill  . aspirin 81 MG tablet Take 81 mg by mouth daily.      . Flaxseed, Linseed, (FLAXSEED OIL) 1000 MG CAPS Take by mouth 2 (two) times daily.      . fluticasone (FLONASE) 50 MCG/ACT nasal spray Place 2 sprays into both nostrils daily. 16 g 6  . ibuprofen (ADVIL,MOTRIN) 200 MG tablet Take 800 mg by mouth every 6 (six) hours as needed for pain.    . meloxicam (MOBIC) 15 MG tablet Take 1 tablet (15 mg total) by mouth daily. 90 tablet 1  . metaxalone (SKELAXIN) 800 MG tablet Take 1 tablet (800 mg total) by mouth 4 (four) times daily as needed. 20 tablet 0  . Multiple Vitamin (MULTIVITAMIN) tablet Take 1 tablet by mouth daily.      . NON FORMULARY at bedtime. CPAP    . ranitidine (ZANTAC) 150 MG capsule Take 1 capsule (150 mg total) by mouth 2 (two) times daily. 60 capsule 0   No current facility-administered medications on file prior to visit.      Objective:  Objective  Physical Exam  Constitutional: Raymond Howell is oriented to person, place, and time. Raymond Howell appears well-developed and well-nourished. No distress.  HENT:  Head: Normocephalic and atraumatic.  Right Ear: External ear normal.  Left Ear: External ear normal.  Nose: Nose normal.  Mouth/Throat: Oropharynx is clear  and moist. No oropharyngeal exudate.  Eyes: Conjunctivae and EOM are normal. Pupils are equal, round, and reactive to light. Right eye exhibits no discharge. Left eye exhibits no discharge.  Neck: Normal range of motion. Neck supple. No JVD present. No thyromegaly present.  Cardiovascular: Normal rate, regular rhythm and intact distal pulses.  No murmur heard. Pulmonary/Chest: Effort normal and breath sounds normal.  No respiratory distress. Raymond Howell has no wheezes. Raymond Howell has no rales. Raymond Howell exhibits no tenderness.  Abdominal: Soft. Bowel sounds are normal. Raymond Howell exhibits no distension and no mass. There is no tenderness. There is no rebound and no guarding.  Genitourinary:  Genitourinary Comments: Per urology  Musculoskeletal: Normal range of motion. Raymond Howell exhibits no edema or tenderness.  Lymphadenopathy:    Raymond Howell has no cervical adenopathy.  Neurological: Raymond Howell is alert and oriented to person, place, and time. Raymond Howell displays normal reflexes. No cranial nerve deficit. Raymond Howell exhibits normal muscle tone.  Skin: Skin is warm and dry. No rash noted. Raymond Howell is not diaphoretic. No erythema.  Psychiatric: Raymond Howell has a normal mood and affect. His behavior is normal. Judgment and thought content normal.  Nursing note and vitals reviewed.  BP 120/76 (BP Location: Right Arm, Cuff Size: Large)   Pulse (!) 51   Temp 98.1 F (36.7 C) (Oral)   Resp 16   Ht 5\' 9"  (1.753 m)   Wt 246 lb 12.8 oz (111.9 kg)   SpO2 98%   BMI 36.45 kg/m  Wt Readings from Last 3 Encounters:  04/17/17 246 lb 12.8 oz (111.9 kg)  02/24/17 240 lb (108.9 kg)  02/19/17 248 lb (112.5 kg)     Lab Results  Component Value Date   WBC 10.8 (H) 03/25/2016   HGB 14.8 03/25/2016   HCT 42.7 03/25/2016   PLT 210.0 03/25/2016   GLUCOSE 105 (H) 03/25/2016   CHOL 152 03/18/2016   TRIG 164 (H) 03/18/2016   HDL 45 03/18/2016   LDLCALC 74 03/18/2016   ALT 21 03/25/2016   AST 21 03/25/2016   NA 140 03/25/2016   K 4.2 03/25/2016   CL 103 03/25/2016   CREATININE 0.73 03/25/2016   BUN 10 03/25/2016   CO2 31 03/25/2016   TSH 1.10 03/18/2016   PSA 1.5 03/18/2016   HGBA1C 4.8 11/25/2013   MICROALBUR <0.7 03/18/2016   ekg-- sinus brady Dg Foot Complete Right  Result Date: 02/24/2017 CLINICAL DATA:  RT foot dorsal ganglion of mid foot at base of 2nd MT npticed about 3-4 months ago but has grown. Hyperechoic area on Ultrasound assess for foreign body. No old or recent injury  EXAM: RIGHT FOOT COMPLETE - 3+ VIEW COMPARISON:  None. FINDINGS: No fracture or dislocation of mid foot or forefoot. The phalanges are normal. The calcaneus is normal. Enthesopathic spurring along the plantar aspect of the calcaneus. No soft tissue abnormality. IMPRESSION: No acute osseous abnormality. Electronically Signed   By: Suzy Bouchard M.D.   On: 02/24/2017 16:16     Assessment & Plan:  Plan  I am having [employer]" maintain his aspirin, Flaxseed Oil, multivitamin, ibuprofen, NON FORMULARY, fluticasone, meloxicam, ranitidine, metaxalone, metoprolol succinate, simvastatin, and Niacin CR.  Meds ordered this encounter  Medications  . metoprolol succinate (TOPROL-XL) 100 MG 24 hr tablet    Sig: Take 1 tablet (100 mg total) by mouth daily.    Dispense:  90 tablet    Refill:  1  . simvastatin (ZOCOR) 40 MG tablet    Sig: Take 1 tablet (  40 mg total) by mouth at bedtime.    Dispense:  90 tablet    Refill:  1  . Niacin CR 1000 MG TBCR    Sig: Take 1 tablet (1,000 mg total) by mouth daily.    Dispense:  90 each    Refill:  1    Problem List Items Addressed This Visit      Unprioritized   Essential hypertension    Well controlled, no changes to meds. Encouraged heart healthy diet such as the DASH diet and exercise as tolerated.       Relevant Medications   metoprolol succinate (TOPROL-XL) 100 MG 24 hr tablet   simvastatin (ZOCOR) 40 MG tablet   Niacin CR 1000 MG TBCR   Other Relevant Orders   EKG 12-Lead (Completed)   Hyperlipidemia LDL goal <100    Tolerating statin, encouraged heart healthy diet, avoid trans fats, minimize simple carbs and saturated fats. Increase exercise as tolerated      Relevant Medications   metoprolol succinate (TOPROL-XL) 100 MG 24 hr tablet   simvastatin (ZOCOR) 40 MG tablet   Preventative health care - Primary    ghm utd shingrix #1  Given to pat Check labs See AVS      Relevant Orders   POCT Urinalysis Dipstick (Automated)    CBC with Differential/Platelet   Comprehensive metabolic panel   Lipid panel   TSH    Other Visit Diagnoses    Hyperlipidemia, unspecified hyperlipidemia type       Relevant Medications   metoprolol succinate (TOPROL-XL) 100 MG 24 hr tablet   simvastatin (ZOCOR) 40 MG tablet   Niacin CR 1000 MG TBCR   Other Relevant Orders   EKG 12-Lead (Completed)      Follow-up: Return in about 6 months (around 10/16/2017) for rto 2 months for shingrix #2.  [name], DO

## 2017-04-17 NOTE — Assessment & Plan Note (Signed)
ghm utd shingrix #1  Given to pat Check labs See AVS

## 2017-04-17 NOTE — Assessment & Plan Note (Signed)
Tolerating statin, encouraged heart healthy diet, avoid trans fats, minimize simple carbs and saturated fats. Increase exercise as tolerated 

## 2017-04-17 NOTE — Patient Instructions (Signed)
Preventive Care 40-64 Years, Male Preventive care refers to lifestyle choices and visits with your health care provider that can promote health and wellness. What does preventive care include?  A yearly physical exam. This is also called an annual well check.  Dental exams once or twice a year.  Routine eye exams. Ask your health care provider how often you should have your eyes checked.  Personal lifestyle choices, including: ? Daily care of your teeth and gums. ? Regular physical activity. ? Eating a healthy diet. ? Avoiding tobacco and drug use. ? Limiting alcohol use. ? Practicing safe sex. ? Taking low-dose aspirin every day starting at age 57. What happens during an annual well check? The services and screenings done by your health care provider during your annual well check will depend on your age, overall health, lifestyle risk factors, and family history of disease. Counseling Your health care provider may ask you questions about your:  Alcohol use.  Tobacco use.  Drug use.  Emotional well-being.  Home and relationship well-being.  Sexual activity.  Eating habits.  Work and work Statistician.  Screening You may have the following tests or measurements:  Height, weight, and BMI.  Blood pressure.  Lipid and cholesterol levels. These may be checked every 5 years, or more frequently if you are over 57 years old.  Skin check.  Lung cancer screening. You may have this screening every year starting at age 57 if you have a 30-pack-year history of smoking and currently smoke or have quit within the past 15 years.  Fecal occult blood test (FOBT) of the stool. You may have this test every year starting at age 57.  Flexible sigmoidoscopy or colonoscopy. You may have a sigmoidoscopy every 5 years or a colonoscopy every 10 years starting at age 57.  Prostate cancer screening. Recommendations will vary depending on your family history and other risks.  Hepatitis C  blood test.  Hepatitis B blood test.  Sexually transmitted disease (STD) testing.  Diabetes screening. This is done by checking your blood sugar (glucose) after you have not eaten for a while (fasting). You may have this done every 1-3 years.  Discuss your test results, treatment options, and if necessary, the need for more tests with your health care provider. Vaccines Your health care provider may recommend certain vaccines, such as:  Influenza vaccine. This is recommended every year.  Tetanus, diphtheria, and acellular pertussis (Tdap, Td) vaccine. You may need a Td booster every 10 years.  Varicella vaccine. You may need this if you have not been vaccinated.  Zoster vaccine. You may need this after age 57.  Measles, mumps, and rubella (MMR) vaccine. You may need at least one dose of MMR if you were born in 1957 or later. You may also need a second dose.  Pneumococcal 13-valent conjugate (PCV13) vaccine. You may need this if you have certain conditions and have not been vaccinated.  Pneumococcal polysaccharide (PPSV23) vaccine. You may need one or two doses if you smoke cigarettes or if you have certain conditions.  Meningococcal vaccine. You may need this if you have certain conditions.  Hepatitis A vaccine. You may need this if you have certain conditions or if you travel or work in places where you may be exposed to hepatitis A.  Hepatitis B vaccine. You may need this if you have certain conditions or if you travel or work in places where you may be exposed to hepatitis B.  Haemophilus influenzae type b (Hib) vaccine.  You may need this if you have certain risk factors.  Talk to your health care provider about which screenings and vaccines you need and how often you need them. This information is not intended to replace advice given to you by your health care provider. Make sure you discuss any questions you have with your health care provider. Document Released: 05/25/2015  Document Revised: 01/16/2016 Document Reviewed: 02/27/2015 Elsevier Interactive Patient Education  2017 [employer] Inc[employer]  

## 2017-04-17 NOTE — Assessment & Plan Note (Signed)
Well controlled, no changes to meds. Encouraged heart healthy diet such as the DASH diet and exercise as tolerated.  °

## 2017-04-18 ENCOUNTER — Encounter: Payer: Self-pay | Admitting: Family Medicine

## 2017-04-23 ENCOUNTER — Other Ambulatory Visit: Payer: Self-pay

## 2017-04-23 DIAGNOSIS — R7989 Other specified abnormal findings of blood chemistry: Secondary | ICD-10-CM

## 2017-04-23 DIAGNOSIS — I10 Essential (primary) hypertension: Secondary | ICD-10-CM

## 2017-04-23 DIAGNOSIS — R945 Abnormal results of liver function studies: Principal | ICD-10-CM

## 2017-05-14 DIAGNOSIS — D485 Neoplasm of uncertain behavior of skin: Secondary | ICD-10-CM | POA: Diagnosis not present

## 2017-05-14 DIAGNOSIS — L821 Other seborrheic keratosis: Secondary | ICD-10-CM | POA: Diagnosis not present

## 2017-05-14 DIAGNOSIS — L814 Other melanin hyperpigmentation: Secondary | ICD-10-CM | POA: Diagnosis not present

## 2017-05-14 DIAGNOSIS — D1801 Hemangioma of skin and subcutaneous tissue: Secondary | ICD-10-CM | POA: Diagnosis not present

## 2017-05-15 ENCOUNTER — Encounter: Payer: Self-pay | Admitting: Family Medicine

## 2017-05-19 ENCOUNTER — Other Ambulatory Visit (INDEPENDENT_AMBULATORY_CARE_PROVIDER_SITE_OTHER): Payer: BLUE CROSS/BLUE SHIELD

## 2017-05-19 DIAGNOSIS — R7989 Other specified abnormal findings of blood chemistry: Secondary | ICD-10-CM

## 2017-05-19 DIAGNOSIS — I10 Essential (primary) hypertension: Secondary | ICD-10-CM

## 2017-05-19 DIAGNOSIS — R945 Abnormal results of liver function studies: Secondary | ICD-10-CM | POA: Diagnosis not present

## 2017-05-19 LAB — HEPATIC FUNCTION PANEL
ALBUMIN: 4.8 g/dL (ref 3.5–5.2)
ALK PHOS: 43 U/L (ref 39–117)
ALT: 38 U/L (ref 0–53)
AST: 38 U/L — ABNORMAL HIGH (ref 0–37)
Bilirubin, Direct: 0.3 mg/dL (ref 0.0–0.3)
Total Bilirubin: 1.4 mg/dL — ABNORMAL HIGH (ref 0.2–1.2)
Total Protein: 7.1 g/dL (ref 6.0–8.3)

## 2017-05-22 ENCOUNTER — Encounter: Payer: Self-pay | Admitting: Family Medicine

## 2017-05-22 NOTE — Telephone Encounter (Signed)
Recheck labs 3 months cmp

## 2017-05-25 ENCOUNTER — Other Ambulatory Visit: Payer: Self-pay | Admitting: *Deleted

## 2017-05-25 DIAGNOSIS — I10 Essential (primary) hypertension: Secondary | ICD-10-CM

## 2017-06-18 ENCOUNTER — Other Ambulatory Visit: Payer: Self-pay | Admitting: Family Medicine

## 2017-06-18 ENCOUNTER — Encounter: Payer: Self-pay | Admitting: Family Medicine

## 2017-06-18 DIAGNOSIS — M79672 Pain in left foot: Secondary | ICD-10-CM

## 2017-06-18 NOTE — Telephone Encounter (Signed)
I have pended referral to podiatry.

## 2017-06-26 ENCOUNTER — Ambulatory Visit: Payer: BLUE CROSS/BLUE SHIELD | Admitting: Family Medicine

## 2017-06-26 ENCOUNTER — Ambulatory Visit (INDEPENDENT_AMBULATORY_CARE_PROVIDER_SITE_OTHER): Payer: BLUE CROSS/BLUE SHIELD

## 2017-06-26 ENCOUNTER — Encounter: Payer: Self-pay | Admitting: Family Medicine

## 2017-06-26 DIAGNOSIS — Z23 Encounter for immunization: Secondary | ICD-10-CM

## 2017-06-26 DIAGNOSIS — M722 Plantar fascial fibromatosis: Secondary | ICD-10-CM

## 2017-06-26 NOTE — Progress Notes (Signed)
Pt comes in this morning for his Shingrix injection. Received in Left Ram without complication. Pt states that his first one caused some pain I advised him to try to move his arm around a lot so it doesn't get stiff or hurt as much this time. He stated he'd comply. Pt left for next appointment upstairs.

## 2017-06-26 NOTE — Progress Notes (Signed)
PCP and consultation requested by: [name], DO  Subjective:   HPI: Patient is a 57 y.o. male here for left heel pain.  Patient denies known injury or trauma. He states about 3-4 months ago he started to feel pain in plantar left heel. Pain worse in the morning, better during the day. Feels like a bruise on bottom of heel. Currently 0/10 level. Has been icing. Tried some OTC inserts but made shoes too tight. No skin changes, numbness.  Past Medical History:  Diagnosis Date  . Angiodysplasia of colon   . Basal cell carcinoma of nose   . Chest pain   . Family history of heart disease   . Hyperlipidemia   . Hypertension   . Sleep apnea    CPAP  . Umbilical hernia   . Venereal wart     Current Outpatient Medications on File Prior to Visit  Medication Sig Dispense Refill  . aspirin 81 MG tablet Take 81 mg by mouth daily.      . Flaxseed, Linseed, (FLAXSEED OIL) 1000 MG CAPS Take by mouth 2 (two) times daily.      . fluticasone (FLONASE) 50 MCG/ACT nasal spray Place 2 sprays into both nostrils daily. 16 g 6  . metaxalone (SKELAXIN) 800 MG tablet Take 1 tablet (800 mg total) by mouth 4 (four) times daily as needed. 20 tablet 0  . metoprolol succinate (TOPROL-XL) 100 MG 24 hr tablet Take 1 tablet (100 mg total) by mouth daily. 90 tablet 1  . Multiple Vitamin (MULTIVITAMIN) tablet Take 1 tablet by mouth daily.      . Niacin CR 1000 MG TBCR Take 1 tablet (1,000 mg total) by mouth daily. 90 each 1  . NON FORMULARY at bedtime. CPAP    . ranitidine (ZANTAC) 150 MG capsule Take 1 capsule (150 mg total) by mouth 2 (two) times daily. 60 capsule 0  . simvastatin (ZOCOR) 40 MG tablet Take 1 tablet (40 mg total) by mouth at bedtime. 90 tablet 1   No current facility-administered medications on file prior to visit.     Past Surgical History:  Procedure Laterality Date  . APPENDECTOMY  1987  . COLONOSCOPY    . HERNIA REPAIR  3235   umbilical  . MOHS SURGERY     nose  .  MOUTH SURGERY     wisdom teeth  . TOOTH EXTRACTION      No Known Allergies  Social History   Socioeconomic History  . Marital status: Married    Spouse name: Not on file  . Number of children: Not on file  . Years of education: Not on file  . Highest education level: Not on file  Social Needs  . Financial resource strain: Not on file  . Food insecurity - worry: Not on file  . Food insecurity - inability: Not on file  . Transportation needs - medical: Not on file  . Transportation needs - non-medical: Not on file  Occupational History  . Not on file  Tobacco Use  . Smoking status: Never Smoker  . Smokeless tobacco: Never Used  Substance and Sexual Activity  . Alcohol use: Yes    Alcohol/week: 1.5 - 2.0 oz    Types: 3 - 4 Standard drinks or equivalent per week    Comment: per week  . Drug use: No  . Sexual activity: Yes    Partners: Female  Other Topics Concern  . Not on file  Social History Narrative  Reg exercise--- no    Family History  Problem Relation Age of Onset  . Heart disease Father 79       MI died @ 62  . Heart attack Maternal Grandfather 42  . Coronary artery disease Other        1st degree relative  . Cancer Other        skin  . Colon cancer Neg Hx   . Stomach cancer Neg Hx     BP 115/73   Pulse (!) 54   Ht 5\' 10"  (1.778 m)   Wt 225 lb (102.1 kg)   BMI 32.28 kg/m   Review of Systems: See HPI above.     Objective:  Physical Exam:  Gen: NAD, comfortable in exam room  Left foot/ankle: Mild pronation and medial ankle subluxation.  No bruising, other deformity. FROM with 5/5 strength all directions and no pain. TTP medial plantar calcaneus at plantar fascia insertion. Negative ant drawer and talar tilt.   Negative syndesmotic compression. Negative calcaneal squeeze. Thompsons test negative. NV intact distally.  Right ankle: No deformity. FROM with 5/5 strength. No tenderness to palpation. NVI distally.   Assessment & Plan:   1. Left plantar fasciitis - reviewed home exercises and stretches to do daily.  Arch binders.  Encouraged arch supports as well.  Icing, tylenol and/or aleve.  Consider physical therapy, injection, night splints.  F/u in 6 weeks.

## 2017-06-26 NOTE — Patient Instructions (Signed)
You have plantar fasciitis Take tylenol and/or aleve as needed for pain  Plantar fascia stretch for 20-30 seconds (do 3 of these) in morning Lowering/raise on a step exercises 3 x 10 once or twice a day - this is very important for long term recovery. Can add heel walks, toe walks forward and backward as well Ice heel for 15 minutes as needed. Avoid flat shoes/barefoot walking as much as possible. Arch straps have been shown to help with pain. Inserts are important (dr. Zoe Lan active series, spencos, our green insoles, custom orthotics). Consider night splints as well. Steroid injection is a consideration for short term pain relief if you are struggling. Physical therapy is also an option. Follow up with me in 6 weeks.

## 2017-06-26 NOTE — Assessment & Plan Note (Signed)
reviewed home exercises and stretches to do daily.  Arch binders.  Encouraged arch supports as well.  Icing, tylenol and/or aleve.  Consider physical therapy, injection, night splints.  F/u in 6 weeks.

## 2017-07-30 ENCOUNTER — Encounter: Payer: Self-pay | Admitting: Family Medicine

## 2017-07-30 DIAGNOSIS — E785 Hyperlipidemia, unspecified: Secondary | ICD-10-CM

## 2017-07-30 DIAGNOSIS — I10 Essential (primary) hypertension: Secondary | ICD-10-CM

## 2017-07-31 MED ORDER — METOPROLOL SUCCINATE ER 100 MG PO TB24: 100.0000 mg | ORAL_TABLET | Freq: Every day | ORAL | 2 refills | Status: DC

## 2017-07-31 MED ORDER — NIACIN ER 1000 MG PO TBCR: 1.0000 | EXTENDED_RELEASE_TABLET | Freq: Every day | ORAL | 2 refills | Status: DC

## 2017-07-31 MED ORDER — SIMVASTATIN 40 MG PO TABS: 40.0000 mg | ORAL_TABLET | Freq: Every day | ORAL | 2 refills | Status: DC

## 2017-08-07 ENCOUNTER — Ambulatory Visit: Payer: BLUE CROSS/BLUE SHIELD | Admitting: Family Medicine

## 2017-08-07 ENCOUNTER — Encounter: Payer: Self-pay | Admitting: Family Medicine

## 2017-08-07 VITALS — BP 133/79 | HR 61 | Ht 69.0 in | Wt 220.0 lb

## 2017-08-07 DIAGNOSIS — M722 Plantar fascial fibromatosis: Secondary | ICD-10-CM | POA: Diagnosis not present

## 2017-08-07 NOTE — Assessment & Plan Note (Signed)
only mild improvement to date with arch binders, home exercises, arch supports.  Will start physical therapy.  Discussed night splints, injection, custom orthotics, active release to consider if not improving.  F/u in 6 weeks.

## 2017-08-07 NOTE — Patient Instructions (Signed)
You have plantar fasciitis Take tylenol and/or aleve as needed for pain  Start physical therapy. Plantar fascia stretch for 20-30 seconds (do 3 of these) in morning Lowering/raise on a step exercises 3 x 10 once or twice a day - this is very important for long term recovery. Can add heel walks, toe walks forward and backward as well Ice heel for 15 minutes as needed. Avoid flat shoes/barefoot walking as much as possible. Arch straps have been shown to help with pain. Inserts are important (dr. Zoe Lan active series, spencos, our green insoles, custom orthotics). I would add night splints too. Steroid injection is a consideration for short term pain relief if you are struggling. Active release, custom orthotics are considerations. Follow up with me in 6 weeks.

## 2017-08-07 NOTE — Progress Notes (Signed)
PCP and consultation requested by: [name], DO  Subjective:   HPI: Patient is a 57 y.o. male here for left heel pain.  2/15: Patient denies known injury or trauma. He states about 3-4 months ago he started to feel pain in plantar left heel. Pain worse in the morning, better during the day. Feels like a bruise on bottom of heel. Currently 0/10 level. Has been icing. Tried some OTC inserts but made shoes too tight. No skin changes, numbness.  3/29: Patient reports hs feels he's mildly improved since last visit. Still with pain at 4-5/10 with prolonged standing, immobility, driving. Has good and bad days. Using arch supports; scaphoid pads in dress shoes. Using arch binders. Doing home exercises. Walks 2 1/2 miles daily and feels better with this. No skin changes, numbness.  Past Medical History:  Diagnosis Date  . Angiodysplasia of colon   . Basal cell carcinoma of nose   . Chest pain   . Family history of heart disease   . Hyperlipidemia   . Hypertension   . Sleep apnea    CPAP  . Umbilical hernia   . Venereal wart     Current Outpatient Medications on File Prior to Visit  Medication Sig Dispense Refill  . aspirin 81 MG tablet Take 81 mg by mouth daily.      . Flaxseed, Linseed, (FLAXSEED OIL) 1000 MG CAPS Take by mouth 2 (two) times daily.      . fluticasone (FLONASE) 50 MCG/ACT nasal spray Place 2 sprays into both nostrils daily. 16 g 6  . metaxalone (SKELAXIN) 800 MG tablet Take 1 tablet (800 mg total) by mouth 4 (four) times daily as needed. 20 tablet 0  . metoprolol succinate (TOPROL-XL) 100 MG 24 hr tablet Take 1 tablet (100 mg total) by mouth daily. 90 tablet 2  . Multiple Vitamin (MULTIVITAMIN) tablet Take 1 tablet by mouth daily.      . niacin (NIASPAN) 1000 MG CR tablet   1  . Niacin CR 1000 MG TBCR Take 1 tablet (1,000 mg total) by mouth daily. 90 each 2  . NON FORMULARY at bedtime. CPAP    . ranitidine (ZANTAC) 150 MG capsule Take 1 capsule  (150 mg total) by mouth 2 (two) times daily. 60 capsule 0  . simvastatin (ZOCOR) 40 MG tablet Take 1 tablet (40 mg total) by mouth at bedtime. 90 tablet 2   No current facility-administered medications on file prior to visit.     Past Surgical History:  Procedure Laterality Date  . APPENDECTOMY  1987  . COLONOSCOPY    . HERNIA REPAIR  4098   umbilical  . MOHS SURGERY     nose  . MOUTH SURGERY     wisdom teeth  . TOOTH EXTRACTION      No Known Allergies  Social History   Socioeconomic History  . Marital status: Married    Spouse name: Not on file  . Number of children: Not on file  . Years of education: Not on file  . Highest education level: Not on file  Occupational History  . Not on file  Social Needs  . Financial resource strain: Not on file  . Food insecurity:    Worry: Not on file    Inability: Not on file  . Transportation needs:    Medical: Not on file    Non-medical: Not on file  Tobacco Use  . Smoking status: Never Smoker  . Smokeless tobacco: Never Used  Substance and Sexual Activity  . Alcohol use: Yes    Alcohol/week: 1.5 - 2.0 oz    Types: 3 - 4 Standard drinks or equivalent per week    Comment: per week  . Drug use: No  . Sexual activity: Yes    Partners: Female  Lifestyle  . Physical activity:    Days per week: Not on file    Minutes per session: Not on file  . Stress: Not on file  Relationships  . Social connections:    Talks on phone: Not on file    Gets together: Not on file    Attends religious service: Not on file    Active member of club or organization: Not on file    Attends meetings of clubs or organizations: Not on file    Relationship status: Not on file  . Intimate partner violence:    Fear of current or ex partner: Not on file    Emotionally abused: Not on file    Physically abused: Not on file    Forced sexual activity: Not on file  Other Topics Concern  . Not on file  Social History Narrative   Reg exercise--- no     Family History  Problem Relation Age of Onset  . Heart disease Father 36       MI died @ 67  . Heart attack Maternal Grandfather 42  . Coronary artery disease Other        1st degree relative  . Cancer Other        skin  . Colon cancer Neg Hx   . Stomach cancer Neg Hx     BP 133/79   Pulse 61   Ht 5\' 9"  (1.753 m)   Wt 220 lb (99.8 kg)   BMI 32.49 kg/m   Review of Systems: See HPI above.     Objective:  Physical Exam:  Gen: NAD comfortable in exam room.  Left foot/ankle: Mild pronation, medial ankle subluxation.  No bruising, other deformity. FROM with 5/5 strength and no pain. Mild TTP medial plantar calcaneus at plantar fascia insertion Negative calcaneal squeeze. Negative ant drawer and talar tilt. NVI distally.  Assessment & Plan:  1. Left plantar fasciitis - only mild improvement to date with arch binders, home exercises, arch supports.  Will start physical therapy.  Discussed night splints, injection, custom orthotics, active release to consider if not improving.  F/u in 6 weeks.

## 2017-08-19 ENCOUNTER — Other Ambulatory Visit: Payer: Self-pay

## 2017-08-19 ENCOUNTER — Ambulatory Visit: Payer: BLUE CROSS/BLUE SHIELD | Attending: Family Medicine | Admitting: Physical Therapy

## 2017-08-19 ENCOUNTER — Encounter: Payer: Self-pay | Admitting: Physical Therapy

## 2017-08-19 DIAGNOSIS — M79672 Pain in left foot: Secondary | ICD-10-CM

## 2017-08-19 DIAGNOSIS — R262 Difficulty in walking, not elsewhere classified: Secondary | ICD-10-CM | POA: Diagnosis not present

## 2017-08-19 DIAGNOSIS — M25675 Stiffness of left foot, not elsewhere classified: Secondary | ICD-10-CM

## 2017-08-19 NOTE — Therapy (Signed)
[facility] [address]  [place], [place], [address] Phone: [phone]   Fax:  [phone]  Physical Therapy Evaluation  Patient Details  Name: Raymond Howell MRN: [id number] Date of Birth: Aug 01, 1960 Referring Provider: Karlton Lemon, MD   Encounter Date: 08/19/2017  PT End of Session - 08/19/17 0843    Visit Number  1    Number of Visits  10    Date for PT Re-Evaluation  09/23/17    Authorization Type  BCBS    PT Start Time  [phone]    PT Stop Time  0939    PT Time Calculation (min)  57 min    Activity Tolerance  Patient tolerated treatment well    Behavior During Therapy  [facility] for tasks assessed/performed       Past Medical History:  Diagnosis Date  . Angiodysplasia of colon   . Basal cell carcinoma of nose   . Chest pain   . Family history of heart disease   . Hyperlipidemia   . Hypertension   . Sleep apnea    CPAP  . Umbilical hernia   . Venereal wart     Past Surgical History:  Procedure Laterality Date  . APPENDECTOMY  1987  . COLONOSCOPY    . HERNIA REPAIR  0254   umbilical  . MOHS SURGERY     nose  . MOUTH SURGERY     wisdom teeth  . TOOTH EXTRACTION      There were no vitals filed for this visit.   Subjective Assessment - 08/19/17 0845    Subjective  Pt. reported that he has plantar fasciitis in L foot, and has been experiencing this for approx. 3-4 months. No specific injury occured at the time, just gradual onset of pain. Visited Dr. Barbaraann Barthel and has been completing stretches, toe/heel walks, and applying ice pack as needed. Pt. stated that he does not experience pain in the morning. However throughout the day with prolonged sitting/standing activities it appears to get stiff and experience pain. Went on vacation this past weekend and did not complete previous exercises and experienced an increase in pain. Pt. is experiencing increase in pain with initiating walking/standing, however once  moving the pain decreases. At the end of the day, has a decrease in tolerance for walking and reported may alter gait pattern due to pain.  Dress shoes seem to cause pain and pt. is required to wear for work. Has been using half moon arches and arch binder.    Limitations  Sitting;Standing    How long can you sit comfortably?  1-2 hours    How long can you stand comfortably?  20-30 minutes    How long can you walk comfortably?  --    Patient Stated Goals  Decrease Pain; Able to walk normal; Able to stand at desk    Currently in Pain?  Yes    Pain Score  3  morning 2-3/10, evening 6-7/10    Pain Location  Foot    Pain Orientation  Left    Pain Descriptors / Indicators  Sharp Feels like a "Bruise"     Pain Type  Acute pain    Pain Onset  More than a month ago    Pain Frequency  Intermittent    Aggravating Factors   Sitting, Standing; Walking at times     Pain Relieving Factors  Ice, Activity, Stretches    Effect of Pain on Daily Activities  Affecting Work Actvities, Affecting Exercise/Jogging still complete however very cautious         [facility] PT Assessment - 08/19/17 0845      Assessment   Medical Diagnosis  L Plantar Fasciitis    Referring Provider  Karlton Lemon, MD    Onset Date/Surgical Date  -- 3-4 months ago    Hand Dominance  Right    Next MD Visit  -- Follow Up in 6 Weeks    Prior Therapy  No      Balance Screen   Has the patient fallen in the past 6 months  No    Has the patient had a decrease in activity level because of a fear of falling?   No    Is the patient reluctant to leave their home because of a fear of falling?   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.)     Leisure  [employer] Work, Engineer, materials, Being Active       ROM / Strength   AROM / PROM / Strength  Strength;AROM      AROM   AROM Assessment Site  Ankle    Right/Left Ankle  Right;Left    Right Ankle Dorsiflexion  5    Right Ankle Plantar Flexion  31    Right Ankle Inversion  21    Right Ankle Eversion  16    Left Ankle Dorsiflexion  3    Left Ankle Plantar Flexion  36    Left Ankle Inversion  24    Left Ankle Eversion  14      Strength   Strength Assessment Site  Ankle;Knee    Right/Left Knee  Left;Right    Right Knee Flexion  4/5    Right Knee Extension  4+/5    Left Knee Flexion  4/5    Left Knee Extension  4+/5    Right/Left Ankle  Right;Left    Right Ankle Dorsiflexion  5/5    Right Ankle Plantar Flexion  5/5    Right Ankle Inversion  5/5    Right Ankle Eversion  4+/5    Left Ankle Dorsiflexion  4+/5    Left Ankle Plantar Flexion  5/5 pain at the beginning    Left Ankle Inversion  4/5    Left Ankle Eversion  4+/5      Palpation   Palpation comment  TTP at L Medial Calcaneal Tubercle w/ mild/mod tightness                Objective measurements completed on examination: See above findings.      [facility] Adult PT Treatment/Exercise - 08/19/17 0001      Self-Care   Self-Care  Heat/Ice Application;Other Self-Care Comments    Heat/Ice Application  Pt. educated on ice massage or frozen water bottle massage to L foot in medial calceaneal region    Other Self-Care Comments   Pt. educated on STM to  plantar fascia using tennis ball       Exercises   Exercises  Ankle      Ankle Exercises: Stretches   Plantar Fascia Stretch  2 reps;30 seconds    Plantar Fascia Stretch Limitations  seated; manual    Gastroc Stretch  2 reps;30 seconds    Gastroc Stretch Limitations  standing at wall             PT Education - 08/19/17 0845    Education provided  Yes    Education  Details  Pt. educated on evaluation findings, plan of care, and initial HEP.     Person(s) Educated  Patient    Methods  Explanation;Demonstration;Handout    Comprehension  Verbalized understanding;Returned demonstration       PT Short Term Goals - 08/19/17 1005      PT SHORT TERM GOAL #1   Title  Independent w/ initial HEP    Status  New    Target Date  09/02/17      PT Long Term Goals - 08/19/17 1006      PT LONG TERM GOAL #1   Title  Independent w/ ongoing HEP     Status  New    Target Date  09/23/17      PT LONG TERM GOAL #2   Title  Pt. will report an increase in standing tolerance to >/= to 45 minutes w/o pain    Status  New    Target Date  09/23/17      PT LONG TERM GOAL #3   Title  Pt. will present with increased ROM in L ankle to [facility] to allow for functional ankle mobility with gait    Status  New    Target Date  09/23/17      PT LONG TERM GOAL #4   Title  Pt. will demonstrate ability to complete 25 heels raises on L ankle with good control and symmetrical pattern.    Status  New    Target Date  09/23/17             Plan - 08/19/17 0845    Clinical Impression Statement  Raymond Howell is a 57 y.o. male that was referred by Dr. Barbaraann Barthel for PT due to plantar fasciitis in the L foot. Pt. stated that has experienced this pain for 3-4 months and described it as "stabbing". Pt. stated that he has been completing stretches and icing the area as needed as prescribed by Dr. Barbaraann Barthel. Pt. reported that the pain is minimal in the morning, however gradually increases throughout the day. Has limited his ability to sit/stand for extended periods of time which is required of his work due to traveling frequently via car. Pt. is able to walk approx. 2 miles without pain in the morning, however in the evening pain increases and leads to alteration of gait pattern. Pt. presented with a restriction in DF ROM of L ankle, however overall good strength in L ankle musculature. Upon palpation,  patient reported tenderness at the medial calcaneal tubercle of L foot and mild/mod tightness was noted in the region. Pt. was educated on stretches and STM massage to the area to help decrease tension to the plantar fascia. Pt. will benefit form skilled physical therapy to alleviate pain in L foot and increase tolerance for functional activities.     Clinical Presentation  Stable    Clinical Presentation due to:  Pt. is an active male presenting with minimal comorbities    Clinical  Decision Making  Low    Rehab Potential  Good    PT Frequency  2x / week    PT Duration  -- 5 weeks    PT Treatment/Interventions  ADLs/Self Care Home Management;Cryotherapy;Dry needling;Iontophoresis 4mg /ml Dexamethasone;Ultrasound;Gait training;Therapeutic activities;Therapeutic exercise;Patient/family education;Manual techniques;Taping;Electrical Stimulation;Passive range of motion;Balance training;Neuromuscular re-education    Consulted and Agree with Plan of Care  Patient       Patient will benefit from skilled therapeutic intervention in order to improve the following deficits and impairments:  Pain, Abnormal gait, Decreased activity tolerance, Decreased endurance, Decreased range of motion, Decreased strength, Impaired flexibility, Difficulty walking, Increased fascial restricitons, Decreased balance  Visit Diagnosis: Pain in left foot  Stiffness of left foot, not elsewhere classified  Difficulty in walking, not elsewhere classified     Problem List Patient Active Problem List   Diagnosis Date Noted  . Plantar fasciitis, left 06/26/2017  . Ganglion cyst of right foot 03/03/2017  . Preventative health care 03/13/2015  . Hyperglycemia 09/05/2013  . Angiodysplasia of cecum 07/12/2013  . Chest pain 04/05/2013  . Obesity (BMI 30-39.9) 03/08/2013  . Obstructive sleep apnea 02/25/2010  . VENEREAL WART 02/02/2007  . BASAL CELL CARCINOMA, NOSE 02/02/2007  . Hyperlipidemia LDL goal <100 02/02/2007  .  Essential hypertension 02/02/2007  . HERNIA, UMBILICAL 63/84/5364  . BASAL CELL CARCINOMA, NOSE 02/02/2007    Raymond Howell, SPT 08/19/2017, 1:29 PM  [facility] [address]  [place], [place], [address] Phone: [phone]   Fax:  [phone]  Name: Raymond Howell MRN: [id number] Date of Birth: March 18, 1961

## 2017-08-25 ENCOUNTER — Ambulatory Visit: Payer: BLUE CROSS/BLUE SHIELD

## 2017-08-25 DIAGNOSIS — R262 Difficulty in walking, not elsewhere classified: Secondary | ICD-10-CM

## 2017-08-25 DIAGNOSIS — M79672 Pain in left foot: Secondary | ICD-10-CM | POA: Diagnosis not present

## 2017-08-25 DIAGNOSIS — M25675 Stiffness of left foot, not elsewhere classified: Secondary | ICD-10-CM | POA: Diagnosis not present

## 2017-08-25 NOTE — Therapy (Signed)
[facility] [address]  [place], [place], [address] Phone: [phone]   Fax:  [phone]  Physical Therapy Treatment  Patient Details  Name: Raymond Howell MRN: [id number] Date of Birth: 1960-10-31 Referring Provider: Karlton Lemon, MD   Encounter Date: 08/25/2017  PT End of Session - 08/25/17 0856    Visit Number  2    Number of Visits  10    Date for PT Re-Evaluation  09/23/17    Authorization Type  BCBS    PT Start Time  0848    PT Stop Time  0937    PT Time Calculation (min)  49 min    Activity Tolerance  Patient tolerated treatment well    Behavior During Therapy  [facility] for tasks assessed/performed       Past Medical History:  Diagnosis Date  . Angiodysplasia of colon   . Basal cell carcinoma of nose   . Chest pain   . Family history of heart disease   . Hyperlipidemia   . Hypertension   . Sleep apnea    CPAP  . Umbilical hernia   . Venereal wart     Past Surgical History:  Procedure Laterality Date  . APPENDECTOMY  1987  . COLONOSCOPY    . HERNIA REPAIR  6387   umbilical  . MOHS SURGERY     nose  . MOUTH SURGERY     wisdom teeth  . TOOTH EXTRACTION      There were no vitals filed for this visit.  Subjective Assessment - 08/25/17 0851    Subjective  Pt. reporting he was standing for ~ 3 hours at concert on Friday night and had increased foot pain Saturday morning.  HEP going well and does not feel need to review.      Patient Stated Goals  Decrease Pain; Able to walk normal; Able to stand at desk    Currently in Pain?  No/denies    Pain Score  0-No pain 7/10 at worst Saturday morning following concert    Pain Location  Foot    Pain Orientation  Left    Pain Descriptors / Indicators  Sharp    Pain Type  Acute pain    Pain Onset  More than a month ago    Pain Frequency  Intermittent    Aggravating Factors   prolonged walking     Pain Relieving Factors  ice, activities     Multiple  Pain Sites  No                       [facility] Adult PT Treatment/Exercise - 08/25/17 0914      Modalities   Modalities  Cryotherapy      Cryotherapy   Number Minutes Cryotherapy  8 Minutes    Cryotherapy Location  Other (comment) L plantar fascia proximal to heel     Type of Cryotherapy  Ice massage Focusing on medial calcaneal region       Manual Therapy   Manual Therapy  Soft tissue mobilization;Passive ROM    Soft tissue mobilization  STM/strumming to L plantar fascia in manual stretch position x 2 min  good tolerance     Passive ROM  Manual L gastroc + great toe extension stretch with therapist x 1 min       Ankle Exercises: Standing   Heel Raises  15 reps;3 seconds    Heel Raises Limitations  counter  Other Standing Ankle Exercises  Side stepping with red looped TB at forefoot 4 x 15 ft at wall  Cues required to maintain neutral foot positioning     Other Standing Ankle Exercises  Heel walk side/side at counter 3 laps       Ankle Exercises: Stretches   Plantar Fascia Stretch  2 reps;30 seconds    Plantar Fascia Stretch Limitations  seated; manual    Gastroc Stretch  2 reps;30 seconds    Gastroc Stretch Limitations  standing at wall    Other Stretch  L tibialis posterior strech with standing leaning into wall with towel under lateral surface of foot 2 x 30 sec     Other Stretch  L gastroc + great toe extension stretch with prostretch 2 x 30 sec       Ankle Exercises: Aerobic   Recumbent Bike  Lvl 1, 7 min                PT Short Term Goals - 08/25/17 2951      PT SHORT TERM GOAL #1   Title  Independent w/ initial HEP    Status  Achieved        PT Long Term Goals - 08/25/17 0858      PT LONG TERM GOAL #1   Title  Independent w/ ongoing HEP     Status  On-going      PT LONG TERM GOAL #2   Title  Pt. will report an increase in standing tolerance to >/= to 45 minutes w/o pain    Status  On-going      PT LONG TERM GOAL #3   Title  Pt.  will present with increased ROM in L ankle to [facility] to allow for functional ankle mobility with gait    Status  On-going      PT LONG TERM GOAL #4   Title  Pt. will demonstrate ability to complete 25 heels raises on L ankle with good control and symmetrical pattern.    Status  On-going            Plan - 08/25/17 0912    Clinical Impression Statement  [name] reporting some increased heel pain on Saturday following attending concert with ~ three hours standing at concert.  HEP going well.  Pt. tolerated all manual STM and stretching to plantar fascia and LE musculature today well.  Added prostretch and side stepping with red band at forefoot without pain today.  Ended treatment with ice massage to L medial calcaneal region and plantar fascia with pt. tolerating well.  Pt. noting pain levels have been low over last few days and ended treatment pain free.  Will continue to progress toward goals.      PT Treatment/Interventions  ADLs/Self Care Home Management;Cryotherapy;Dry needling;Iontophoresis 4mg /ml Dexamethasone;Ultrasound;Gait training;Therapeutic activities;Therapeutic exercise;Patient/family education;Manual techniques;Taping;Electrical Stimulation;Passive range of motion;Balance training;Neuromuscular re-education    Consulted and Agree with Plan of Care  Patient       Patient will benefit from skilled therapeutic intervention in order to improve the following deficits and impairments:  Pain, Abnormal gait, Decreased activity tolerance, Decreased endurance, Decreased range of motion, Decreased strength, Impaired flexibility, Difficulty walking, Increased fascial restricitons, Decreased balance  Visit Diagnosis: Pain in left foot  Stiffness of left foot, not elsewhere classified  Difficulty in walking, not elsewhere classified     Problem List Patient Active Problem List   Diagnosis Date Noted  . Plantar fasciitis, left 06/26/2017  . Ganglion cyst of  right foot 03/03/2017  .  Preventative health care 03/13/2015  . Hyperglycemia 09/05/2013  . Angiodysplasia of cecum 07/12/2013  . Chest pain 04/05/2013  . Obesity (BMI 30-39.9) 03/08/2013  . Obstructive sleep apnea 02/25/2010  . VENEREAL WART 02/02/2007  . BASAL CELL CARCINOMA, NOSE 02/02/2007  . Hyperlipidemia LDL goal <100 02/02/2007  . Essential hypertension 02/02/2007  . HERNIA, UMBILICAL 12/23/4816  . BASAL CELL CARCINOMA, NOSE 02/02/2007    Bess Harvest, PTA 08/25/17 10:02 AM  [facility] [address]  [place], [place], [address] Phone: [phone]   Fax:  [phone]  Name: Raymond Howell MRN: [id number] Date of Birth: [birth year]

## 2017-08-26 ENCOUNTER — Encounter: Payer: Self-pay | Admitting: Family Medicine

## 2017-08-28 ENCOUNTER — Encounter: Payer: Self-pay | Admitting: Physical Therapy

## 2017-08-28 ENCOUNTER — Ambulatory Visit: Payer: BLUE CROSS/BLUE SHIELD | Admitting: Physical Therapy

## 2017-08-28 DIAGNOSIS — M79672 Pain in left foot: Secondary | ICD-10-CM | POA: Diagnosis not present

## 2017-08-28 DIAGNOSIS — R262 Difficulty in walking, not elsewhere classified: Secondary | ICD-10-CM

## 2017-08-28 DIAGNOSIS — M25675 Stiffness of left foot, not elsewhere classified: Secondary | ICD-10-CM | POA: Diagnosis not present

## 2017-08-28 NOTE — Therapy (Signed)
[facility] [address]  [place], [place], [address] Phone: [phone]   Fax:  [phone]  Physical Therapy Treatment  Patient Details  Name: Raymond Howell MRN: [id number] Date of Birth: 14-Aug-1960 Referring Provider: Karlton Lemon, MD   Encounter Date: 08/28/2017  PT End of Session - 08/28/17 0802    Visit Number  3    Number of Visits  10    Date for PT Re-Evaluation  09/23/17    Authorization Type  BCBS    PT Start Time  0802    PT Stop Time  0850    PT Time Calculation (min)  48 min    Activity Tolerance  Patient tolerated treatment well    Behavior During Therapy  [facility] for tasks assessed/performed       Past Medical History:  Diagnosis Date  . Angiodysplasia of colon   . Basal cell carcinoma of nose   . Chest pain   . Family history of heart disease   . Hyperlipidemia   . Hypertension   . Sleep apnea    CPAP  . Umbilical hernia   . Venereal wart     Past Surgical History:  Procedure Laterality Date  . APPENDECTOMY  1987  . COLONOSCOPY    . HERNIA REPAIR  1660   umbilical  . MOHS SURGERY     nose  . MOUTH SURGERY     wisdom teeth  . TOOTH EXTRACTION      There were no vitals filed for this visit.  Subjective Assessment - 08/28/17 0804    Subjective  Pt reporting pain improving overall with improving tolerance for walking.    Patient Stated Goals  Decrease Pain; Able to walk normal; Able to stand at desk    Currently in Pain?  Yes    Pain Score  3     Pain Location  Foot    Pain Orientation  Left    Pain Descriptors / Indicators  Sore    Pain Type  Acute pain    Pain Frequency  Intermittent                       [facility] Adult PT Treatment/Exercise - 08/28/17 0802      Exercises   Exercises  Ankle      Modalities   Modalities  Cryotherapy      Cryotherapy   Number Minutes Cryotherapy  5 Minutes    Cryotherapy Location  Other (comment) L plantar fascia    Type of Cryotherapy  Ice massage      Manual Therapy   Manual Therapy  Soft tissue mobilization;Myofascial release;Taping    Soft tissue mobilization  STM/strumming to L plantar fascia in manual stretch position    Myofascial Release  L plantar fascia    Kinesiotex  Create Space      Kinesiotix   Create Space  L plantar fascia      Ankle Exercises: Stretches   Other Stretch  L gastroc + great toe extension stretch with prostretch 2 x 30 sec       Ankle Exercises: Aerobic   Recumbent Bike  L2 x 6'      Ankle Exercises: Standing   Vector Stance  Left 2 x 10    Vector Stance Limitations  4 way clocks - 1st set on firm surface with R toe tap to colored discs, 2nd set on blue foam oval with R  toe tap to balance pebbles (1 pole A necessary for 2nd set)    SLS  L SLS with B pallof press with red TB    Heel Raises  Both;10 reps;3 seconds 2 sets    Heel Raises Limitations  negative heel raise on back of UBE, 2nd set with L eccentric lowering    Other Standing Ankle Exercises  B side stepping with R TB at forefoot 4 x 20 ft      Ankle Exercises: Seated   Towel Crunch  2 reps    Marble Pickup  x20               PT Short Term Goals - 08/25/17 4010      PT SHORT TERM GOAL #1   Title  Independent w/ initial HEP    Status  Achieved        PT Long Term Goals - 08/25/17 0858      PT LONG TERM GOAL #1   Title  Independent w/ ongoing HEP     Status  On-going      PT LONG TERM GOAL #2   Title  Pt. will report an increase in standing tolerance to >/= to 45 minutes w/o pain    Status  On-going      PT LONG TERM GOAL #3   Title  Pt. will present with increased ROM in L ankle to [facility] to allow for functional ankle mobility with gait    Status  On-going      PT LONG TERM GOAL #4   Title  Pt. will demonstrate ability to complete 25 heels raises on L ankle with good control and symmetrical pattern.    Status  On-going            Plan - 08/28/17 0806    Clinical Impression  Statement  Pt reporting good relief following last treatement lasting into the next day. Able to go for is walk this morning but noting increased soreness following this. Progressed ankle and intrinsic foot muscle strengthening today with some faitgue noted but no increased pain. Crepitus noted in plantar fascia during manual therapy. Introduced trial of kinesiotaping following manual therapy and ice massage to plantar fascia and will assess response on next visit.    Rehab Potential  Good    PT Treatment/Interventions  ADLs/Self Care Home Management;Cryotherapy;Dry needling;Iontophoresis 4mg /ml Dexamethasone;Ultrasound;Gait training;Therapeutic activities;Therapeutic exercise;Patient/family education;Manual techniques;Taping;Electrical Stimulation;Passive range of motion;Balance training;Neuromuscular re-education    Consulted and Agree with Plan of Care  Patient       Patient will benefit from skilled therapeutic intervention in order to improve the following deficits and impairments:  Pain, Abnormal gait, Decreased activity tolerance, Decreased endurance, Decreased range of motion, Decreased strength, Impaired flexibility, Difficulty walking, Increased fascial restricitons, Decreased balance  Visit Diagnosis: Pain in left foot  Stiffness of left foot, not elsewhere classified  Difficulty in walking, not elsewhere classified     Problem List Patient Active Problem List   Diagnosis Date Noted  . Plantar fasciitis, left 06/26/2017  . Ganglion cyst of right foot 03/03/2017  . Preventative health care 03/13/2015  . Hyperglycemia 09/05/2013  . Angiodysplasia of cecum 07/12/2013  . Chest pain 04/05/2013  . Obesity (BMI 30-39.9) 03/08/2013  . Obstructive sleep apnea 02/25/2010  . VENEREAL WART 02/02/2007  . BASAL CELL CARCINOMA, NOSE 02/02/2007  . Hyperlipidemia LDL goal <100 02/02/2007  . Essential hypertension 02/02/2007  . HERNIA, UMBILICAL 27/25/3664  . BASAL CELL CARCINOMA, NOSE  02/02/2007  Percival Spanish, PT, MPT 08/28/2017, 9:26 AM  [facility] [address]  [place], [place], [address] Phone: [phone]   Fax:  [phone]  Name: JOEANTHONY SEELING MRN: [id number] Date of Birth: July 15, 1960

## 2017-08-31 ENCOUNTER — Ambulatory Visit: Payer: BLUE CROSS/BLUE SHIELD

## 2017-08-31 ENCOUNTER — Encounter: Payer: Self-pay | Admitting: Family Medicine

## 2017-08-31 ENCOUNTER — Other Ambulatory Visit (INDEPENDENT_AMBULATORY_CARE_PROVIDER_SITE_OTHER): Payer: BLUE CROSS/BLUE SHIELD

## 2017-08-31 DIAGNOSIS — R739 Hyperglycemia, unspecified: Secondary | ICD-10-CM

## 2017-08-31 DIAGNOSIS — I10 Essential (primary) hypertension: Secondary | ICD-10-CM | POA: Diagnosis not present

## 2017-08-31 DIAGNOSIS — M25675 Stiffness of left foot, not elsewhere classified: Secondary | ICD-10-CM | POA: Diagnosis not present

## 2017-08-31 DIAGNOSIS — R262 Difficulty in walking, not elsewhere classified: Secondary | ICD-10-CM | POA: Diagnosis not present

## 2017-08-31 DIAGNOSIS — M79672 Pain in left foot: Secondary | ICD-10-CM | POA: Diagnosis not present

## 2017-08-31 DIAGNOSIS — E782 Mixed hyperlipidemia: Secondary | ICD-10-CM

## 2017-08-31 LAB — COMPREHENSIVE METABOLIC PANEL
ALBUMIN: 4.3 g/dL (ref 3.5–5.2)
ALT: 20 U/L (ref 0–53)
AST: 25 U/L (ref 0–37)
Alkaline Phosphatase: 44 U/L (ref 39–117)
BILIRUBIN TOTAL: 1 mg/dL (ref 0.2–1.2)
BUN: 17 mg/dL (ref 6–23)
CHLORIDE: 103 meq/L (ref 96–112)
CO2: 28 mEq/L (ref 19–32)
CREATININE: 0.81 mg/dL (ref 0.40–1.50)
Calcium: 9.5 mg/dL (ref 8.4–10.5)
GFR: 104.08 mL/min (ref >60.00)
Glucose, Bld: 122 mg/dL — ABNORMAL HIGH (ref 70–99)
Potassium: 4.6 mEq/L (ref 3.5–5.1)
SODIUM: 139 meq/L (ref 135–145)
Total Protein: 6.6 g/dL (ref 6.0–8.3)

## 2017-08-31 NOTE — Therapy (Signed)
[facility] [address]  [place], [place], [address] Phone: [phone]   Fax:  [phone]  Physical Therapy Treatment  Patient Details  Name: Raymond Howell MRN: [id number] Date of Birth: 1961-02-13 Referring Provider: Karlton Lemon, MD   Encounter Date: 08/31/2017  PT End of Session - 08/31/17 0804    Visit Number  4    Number of Visits  10    Date for PT Re-Evaluation  09/23/17    Authorization Type  BCBS    PT Start Time  0801    PT Stop Time  0843    PT Time Calculation (min)  42 min    Activity Tolerance  Patient tolerated treatment well    Behavior During Therapy  [facility] for tasks assessed/performed       Past Medical History:  Diagnosis Date  . Angiodysplasia of colon   . Basal cell carcinoma of nose   . Chest pain   . Family history of heart disease   . Hyperlipidemia   . Hypertension   . Sleep apnea    CPAP  . Umbilical hernia   . Venereal wart     Past Surgical History:  Procedure Laterality Date  . APPENDECTOMY  1987  . COLONOSCOPY    . HERNIA REPAIR  9449   umbilical  . MOHS SURGERY     nose  . MOUTH SURGERY     wisdom teeth  . TOOTH EXTRACTION      There were no vitals filed for this visit.  Subjective Assessment - 08/31/17 0802    Subjective  Pt. reporting unsure of benefit from taping applied last visit however has been feeling well over weekend.      Patient Stated Goals  Decrease Pain; Able to walk normal; Able to stand at desk    Currently in Pain?  No/denies    Pain Score  0-No pain up to 5/10 pain at its worst     Multiple Pain Sites  No                       [facility] Adult PT Treatment/Exercise - 08/31/17 0809      Manual Therapy   Manual Therapy  Soft tissue mobilization;Myofascial release    Soft tissue mobilization  STM/strumming to L plantar fascia in manual stretch position    Myofascial Release  L plantar fascia proximal to heel      Ankle  Exercises: Standing   Vector Stance  Left    Vector Stance Limitations  L SLS with opposite LE toe-touch to cones 2 x 30; 1 ski pole    SLS  L SLS on foam with R toe-touch to 9" step no UE support x 15 reps     Heel Raises  3 seconds;15 reps;Both    Heel Raises Limitations  at UBE, L eccentric lowering    Balance Beam  Alternating cone nock over/righting x 10 cones on blue foam balance beam with 1 ski pole support     Other Standing Ankle Exercises  Monster walk forwards with red looped TB at forefoot 2 x 30 ft     Other Standing Ankle Exercises  L SLS 4-way hip kicker with red TB at ankle x 10 reps; 2 ski poles       Ankle Exercises: Aerobic   Recumbent Bike  L2 x 6'  PT Education - 08/31/17 0844    Education provided  Yes    Education Details  L SLS with clocks, B con/L ecc heel raise     Person(s) Educated  Patient    Methods  Explanation;Demonstration;Verbal cues;Handout    Comprehension  Verbalized understanding;Returned demonstration;Verbal cues required;Need further instruction       PT Short Term Goals - 08/25/17 0858      PT SHORT TERM GOAL #1   Title  Independent w/ initial HEP    Status  Achieved        PT Long Term Goals - 08/25/17 0858      PT LONG TERM GOAL #1   Title  Independent w/ ongoing HEP     Status  On-going      PT LONG TERM GOAL #2   Title  Pt. will report an increase in standing tolerance to >/= to 45 minutes w/o pain    Status  On-going      PT LONG TERM GOAL #3   Title  Pt. will present with increased ROM in L ankle to [facility] to allow for functional ankle mobility with gait    Status  On-going      PT LONG TERM GOAL #4   Title  Pt. will demonstrate ability to complete 25 heels raises on L ankle with good control and symmetrical pattern.    Status  On-going            Plan - 08/31/17 0804    Clinical Impression Statement  Raymond Howell reporting he felt fine following last visit.  Notes improvement in comfort levels with  walking for remainder of day following therapy sessions.  Tolerated all SLS and L ankle stability training activities in treatment well today.  Continued manual strumming/STM to L plantar fascia with some tenderness noted at proximal heel however pt. feels this is improving.  Ended treatment pain free.  Pt. seems to be responding well to all activities performed in treatment and progressing well toward goals.      PT Treatment/Interventions  ADLs/Self Care Home Management;Cryotherapy;Dry needling;Iontophoresis 4mg /ml Dexamethasone;Ultrasound;Gait training;Therapeutic activities;Therapeutic exercise;Patient/family education;Manual techniques;Taping;Electrical Stimulation;Passive range of motion;Balance training;Neuromuscular re-education    Consulted and Agree with Plan of Care  Patient       Patient will benefit from skilled therapeutic intervention in order to improve the following deficits and impairments:  Pain, Abnormal gait, Decreased activity tolerance, Decreased endurance, Decreased range of motion, Decreased strength, Impaired flexibility, Difficulty walking, Increased fascial restricitons, Decreased balance  Visit Diagnosis: Pain in left foot  Stiffness of left foot, not elsewhere classified  Difficulty in walking, not elsewhere classified     Problem List Patient Active Problem List   Diagnosis Date Noted  . Plantar fasciitis, left 06/26/2017  . Ganglion cyst of right foot 03/03/2017  . Preventative health care 03/13/2015  . Hyperglycemia 09/05/2013  . Angiodysplasia of cecum 07/12/2013  . Chest pain 04/05/2013  . Obesity (BMI 30-39.9) 03/08/2013  . Obstructive sleep apnea 02/25/2010  . VENEREAL WART 02/02/2007  . BASAL CELL CARCINOMA, NOSE 02/02/2007  . Hyperlipidemia LDL goal <100 02/02/2007  . Essential hypertension 02/02/2007  . HERNIA, UMBILICAL 26/71/2458  . BASAL CELL CARCINOMA, NOSE 02/02/2007    Bess Harvest, PTA 08/31/17 12:32 PM  [facility] [address]  [place], [place], [address] Phone: [phone]   Fax:  [phone]  Name: Raymond Howell MRN: [id number] Date of Birth: 08-Apr-1961

## 2017-09-02 ENCOUNTER — Encounter: Payer: Self-pay | Admitting: Physical Therapy

## 2017-09-02 ENCOUNTER — Ambulatory Visit: Payer: BLUE CROSS/BLUE SHIELD | Admitting: Physical Therapy

## 2017-09-02 DIAGNOSIS — M25675 Stiffness of left foot, not elsewhere classified: Secondary | ICD-10-CM | POA: Diagnosis not present

## 2017-09-02 DIAGNOSIS — R262 Difficulty in walking, not elsewhere classified: Secondary | ICD-10-CM

## 2017-09-02 DIAGNOSIS — M79672 Pain in left foot: Secondary | ICD-10-CM

## 2017-09-02 NOTE — Therapy (Signed)
[facility] [address]  [place], [place], [address] Phone: [phone]   Fax:  [phone]  Physical Therapy Treatment  Patient Details  Name: Raymond Howell MRN: [id number] Date of Birth: Sep 22, 1960 Referring Provider: Karlton Lemon, MD   Encounter Date: 09/02/2017  PT End of Session - 09/02/17 0944    Visit Number  5    Number of Visits  10    Date for PT Re-Evaluation  09/23/17    Authorization Type  BCBS    PT Start Time  0847    PT Stop Time  0933    PT Time Calculation (min)  46 min    Activity Tolerance  Patient tolerated treatment well    Behavior During Therapy  [facility] for tasks assessed/performed       Past Medical History:  Diagnosis Date  . Angiodysplasia of colon   . Basal cell carcinoma of nose   . Chest pain   . Family history of heart disease   . Hyperlipidemia   . Hypertension   . Sleep apnea    CPAP  . Umbilical hernia   . Venereal wart     Past Surgical History:  Procedure Laterality Date  . APPENDECTOMY  1987  . COLONOSCOPY    . HERNIA REPAIR  0109   umbilical  . MOHS SURGERY     nose  . MOUTH SURGERY     wisdom teeth  . TOOTH EXTRACTION      There were no vitals filed for this visit.  Subjective Assessment - 09/02/17 0847    Subjective  Pt. reporting pain at times, staying primarily in the heel of L foot. Has still been walking every morning and this helps tremendously.    Patient Stated Goals  Decrease Pain; Able to walk normal; Able to stand at desk    Currently in Pain?  No/denies    Pain Score  0-No pain                       [facility] Adult PT Treatment/Exercise - 09/02/17 0847      Self-Care   Other Self-Care Comments   Pt. roll out w/ tennis ball after [place] x 1 min      Modalities   Modalities  Ultrasound      Ultrasound   Ultrasound Location  L Plantar Fascia    Ultrasound Parameters  3.3 MHz, 0.5 W/cm2, 8 Minutes    Ultrasound Goals   Pain;Other (Comment) Decrease Inflammation      Manual Therapy   Manual Therapy  Soft tissue mobilization    Soft tissue mobilization  STM/strumming to L plantar fascia      Ankle Exercises: Aerobic   Recumbent Bike  L2 x 6'       Ankle Exercises: Stretches   Plantar Fascia Stretch  3 reps;30 seconds    Plantar Fascia Stretch Limitations  standing at wall      Ankle Exercises: Standing   Vector Stance Limitations  L SLS on foam w/ UE yellow MBall passes x 15    Heel Raises  Both;15 reps;3 seconds    Heel Raises Limitations  at window seal w/ toes on towel; L eccentric lowering; attempted with half foam roll however pt. unable to tolerate    Other Standing Ankle Exercises  Lateral Walks w/ Yellow looped TB wrapped around ankles/big toes x 30 ft.     Other Standing Ankle Exercises  attempted standing arch lifts, pt. unable to complete       Ankle Exercises: Supine   Other Supine Ankle Exercises  Resisted Toe Curls w/ Red TB x 15 (Pt. in long sitting)             PT Education - 09/02/17 0943    Education provided  Yes    Education Details  HEP Update (Plantar Fascia Stretch - Wall)     Person(s) Educated  Patient    Methods  Explanation;Demonstration;Handout    Comprehension  Verbalized understanding;Returned demonstration       PT Short Term Goals - 08/25/17 0858      PT SHORT TERM GOAL #1   Title  Independent w/ initial HEP    Status  Achieved        PT Long Term Goals - 08/25/17 0858      PT LONG TERM GOAL #1   Title  Independent w/ ongoing HEP     Status  On-going      PT LONG TERM GOAL #2   Title  Pt. will report an increase in standing tolerance to >/= to 45 minutes w/o pain    Status  On-going      PT LONG TERM GOAL #3   Title  Pt. will present with increased ROM in L ankle to [facility] to allow for functional ankle mobility with gait    Status  On-going      PT LONG TERM GOAL #4   Title  Pt. will demonstrate ability to complete 25 heels raises on L ankle  with good control and symmetrical pattern.    Status  On-going            Plan - 09/02/17 0945    Clinical Impression Statement  Raymond Howell reported that he is currently only having pain on L medial heel, and overall feels as he is seeing improvements with therapy sessions. Began session w/ [place] to allow for decrease in inflammation/pain to L plantar fascia region, and followed with stretching & STM to the region to help facilitate a decrease in tension of L plantar fascia. Progressed strenghtening exercises of plantar fascia/foot intrinsics, pt. overall tolerated treatment well. Pt unable to complete heel raises w/ toes on half foam roll due to pain, therefore subsituted w/ towel roll.     PT Treatment/Interventions  ADLs/Self Care Home Management;Cryotherapy;Dry needling;Iontophoresis 4mg /ml Dexamethasone;Ultrasound;Gait training;Therapeutic activities;Therapeutic exercise;Patient/family education;Manual techniques;Taping;Electrical Stimulation;Passive range of motion;Balance training;Neuromuscular re-education    Consulted and Agree with Plan of Care  Patient       Patient will benefit from skilled therapeutic intervention in order to improve the following deficits and impairments:  Pain, Abnormal gait, Decreased activity tolerance, Decreased endurance, Decreased range of motion, Decreased strength, Impaired flexibility, Difficulty walking, Increased fascial restricitons, Decreased balance  Visit Diagnosis: Pain in left foot  Stiffness of left foot, not elsewhere classified  Difficulty in walking, not elsewhere classified     Problem List Patient Active Problem List   Diagnosis Date Noted  . Plantar fasciitis, left 06/26/2017  . Ganglion cyst of right foot 03/03/2017  . Preventative health care 03/13/2015  . Hyperglycemia 09/05/2013  . Angiodysplasia of cecum 07/12/2013  . Chest pain 04/05/2013  . Obesity (BMI 30-39.9) 03/08/2013  . Obstructive sleep apnea 02/25/2010  . VENEREAL  WART 02/02/2007  . BASAL CELL CARCINOMA, NOSE 02/02/2007  . Hyperlipidemia LDL goal <100 02/02/2007  . Essential hypertension 02/02/2007  . HERNIA, UMBILICAL 97/67/3419  . BASAL CELL CARCINOMA, NOSE  02/02/2007    Baldomero Lamy, SPT 09/02/2017, 10:02 AM  [facility] [address]  [place], [place], [address] Phone: [phone]   Fax:  [phone]  Name: Raymond Howell MRN: [id number] Date of Birth: Mar 16, 1961

## 2017-09-11 ENCOUNTER — Ambulatory Visit: Payer: BLUE CROSS/BLUE SHIELD | Attending: Family Medicine | Admitting: Physical Therapy

## 2017-09-11 ENCOUNTER — Encounter: Payer: Self-pay | Admitting: Physical Therapy

## 2017-09-11 DIAGNOSIS — R262 Difficulty in walking, not elsewhere classified: Secondary | ICD-10-CM | POA: Diagnosis not present

## 2017-09-11 DIAGNOSIS — M25675 Stiffness of left foot, not elsewhere classified: Secondary | ICD-10-CM | POA: Insufficient documentation

## 2017-09-11 DIAGNOSIS — M79672 Pain in left foot: Secondary | ICD-10-CM | POA: Insufficient documentation

## 2017-09-11 NOTE — Therapy (Signed)
[facility] [address]  [place], [place], [address] Phone: [phone]   Fax:  [phone]  Physical Therapy Treatment  Patient Details  Name: Raymond Howell MRN: [id number] Date of Birth: 1960/09/19 Referring Provider: Karlton Lemon, MD   Encounter Date: 09/11/2017  PT End of Session - 09/11/17 1103    Visit Number  6    Number of Visits  10    Date for PT Re-Evaluation  09/23/17    Authorization Type  BCBS    PT Start Time  1103    PT Stop Time  1146    PT Time Calculation (min)  43 min    Activity Tolerance  Patient tolerated treatment well    Behavior During Therapy  [facility] for tasks assessed/performed       Past Medical History:  Diagnosis Date  . Angiodysplasia of colon   . Basal cell carcinoma of nose   . Chest pain   . Family history of heart disease   . Hyperlipidemia   . Hypertension   . Sleep apnea    CPAP  . Umbilical hernia   . Venereal wart     Past Surgical History:  Procedure Laterality Date  . APPENDECTOMY  1987  . COLONOSCOPY    . HERNIA REPAIR  6767   umbilical  . MOHS SURGERY     nose  . MOUTH SURGERY     wisdom teeth  . TOOTH EXTRACTION      There were no vitals filed for this visit.  Subjective Assessment - 09/11/17 1103    Subjective  Pt. reporting that he believed that the [place] that was completed in previous session gave him relief, and seemed to help as the intensity of pain has decreased. Has been traveling all week due to work, and work shoes has caused him some pain.     Patient Stated Goals  Decrease Pain; Able to walk normal; Able to stand at desk    Currently in Pain?  No/denies    Pain Score  0-No pain                       [facility] Adult PT Treatment/Exercise - 09/11/17 1103      Modalities   Modalities  Ultrasound      Ultrasound   Ultrasound Location  L Plantar Fascia    Ultrasound Parameters  3.3 Mhz, 08. W/Cm2, 10 mins    Ultrasound Goals   Pain;Other (Comment)      Manual Therapy   Manual Therapy  Soft tissue mobilization    Manual therapy comments  PT manual stretching to L Plantar Fascia    Soft tissue mobilization  STM/strumming to L plantar fascia      Ankle Exercises: Aerobic   Nustep  L5 x 6'      Ankle Exercises: Stretches   Plantar Fascia Stretch  2 reps;30 seconds    Plantar Fascia Stretch Limitations  standing at wall      Ankle Exercises: Standing   SLS  L SLS on Bosu w/ 2 pole assist as needed, 3 x 30"    Heel Raises  Left;15 reps;3 seconds    Heel Raises Limitations  at UBE, L Eccentric lowering    Heel Walk (Round Trip)  50 ft walk w/ alternating heel lift     Other Standing Ankle Exercises  alternating forward lunges on bosu x 15 reps  PT Short Term Goals - 08/25/17 3300      PT SHORT TERM GOAL #1   Title  Independent w/ initial HEP    Status  Achieved        PT Long Term Goals - 08/25/17 0858      PT LONG TERM GOAL #1   Title  Independent w/ ongoing HEP     Status  On-going      PT LONG TERM GOAL #2   Title  Pt. will report an increase in standing tolerance to >/= to 45 minutes w/o pain    Status  On-going      PT LONG TERM GOAL #3   Title  Pt. will present with increased ROM in L ankle to [facility] to allow for functional ankle mobility with gait    Status  On-going      PT LONG TERM GOAL #4   Title  Pt. will demonstrate ability to complete 25 heels raises on L ankle with good control and symmetrical pattern.    Status  On-going            Plan - 09/11/17 1153    Clinical Impression Statement  Raymond Howell reporting that his pain has decreaed in intensity since last therapy session, in which he believed [place] helped. Pt. has been traveling all week with work, and reported didn't get to walk & complete exercises as frequently. Also experienced increased pain/discomfort due to work shoes. Began session w/ [place] to allow for continued decrease in pain/inflammation, and followed  with STM and manual stretching to L plantar fascia. Continued progression of therapeutic exercises focusing aroundinstrinsic strengthening and balance of L foot.     PT Treatment/Interventions  ADLs/Self Care Home Management;Cryotherapy;Dry needling;Iontophoresis 4mg /ml Dexamethasone;Ultrasound;Gait training;Therapeutic activities;Therapeutic exercise;Patient/family education;Manual techniques;Taping;Electrical Stimulation;Passive range of motion;Balance training;Neuromuscular re-education    Consulted and Agree with Plan of Care  Patient       Patient will benefit from skilled therapeutic intervention in order to improve the following deficits and impairments:  Pain, Abnormal gait, Decreased activity tolerance, Decreased endurance, Decreased range of motion, Decreased strength, Impaired flexibility, Difficulty walking, Increased fascial restricitons, Decreased balance  Visit Diagnosis: Pain in left foot  Stiffness of left foot, not elsewhere classified  Difficulty in walking, not elsewhere classified     Problem List Patient Active Problem List   Diagnosis Date Noted  . Plantar fasciitis, left 06/26/2017  . Ganglion cyst of right foot 03/03/2017  . Preventative health care 03/13/2015  . Hyperglycemia 09/05/2013  . Angiodysplasia of cecum 07/12/2013  . Chest pain 04/05/2013  . Obesity (BMI 30-39.9) 03/08/2013  . Obstructive sleep apnea 02/25/2010  . VENEREAL WART 02/02/2007  . BASAL CELL CARCINOMA, NOSE 02/02/2007  . Hyperlipidemia LDL goal <100 02/02/2007  . Essential hypertension 02/02/2007  . HERNIA, UMBILICAL 76/22/6333  . BASAL CELL CARCINOMA, NOSE 02/02/2007    Baldomero Lamy, SPT 09/11/2017, 12:04 PM  [facility] [address]  [place], [place], [address] Phone: [phone]   Fax:  [phone]  Name: Raymond Howell MRN: [id number] Date of Birth: July 16, 1960

## 2017-09-14 ENCOUNTER — Ambulatory Visit: Payer: BLUE CROSS/BLUE SHIELD

## 2017-09-17 ENCOUNTER — Encounter: Payer: Self-pay | Admitting: Physical Therapy

## 2017-09-17 ENCOUNTER — Encounter: Payer: Self-pay | Admitting: Pulmonary Disease

## 2017-09-17 ENCOUNTER — Ambulatory Visit: Payer: BLUE CROSS/BLUE SHIELD | Admitting: Pulmonary Disease

## 2017-09-17 ENCOUNTER — Ambulatory Visit: Payer: BLUE CROSS/BLUE SHIELD | Admitting: Physical Therapy

## 2017-09-17 DIAGNOSIS — R262 Difficulty in walking, not elsewhere classified: Secondary | ICD-10-CM

## 2017-09-17 DIAGNOSIS — M79672 Pain in left foot: Secondary | ICD-10-CM | POA: Diagnosis not present

## 2017-09-17 DIAGNOSIS — M25675 Stiffness of left foot, not elsewhere classified: Secondary | ICD-10-CM | POA: Diagnosis not present

## 2017-09-17 DIAGNOSIS — G4733 Obstructive sleep apnea (adult) (pediatric): Secondary | ICD-10-CM

## 2017-09-17 NOTE — Therapy (Signed)
[facility] [address]  [place], [place], [address] Phone: [phone]   Fax:  [phone]  Physical Therapy Treatment  Patient Details  Name: Raymond Howell MRN: [id number] Date of Birth: 12-02-1960 Referring Provider: Karlton Lemon, MD   Encounter Date: 09/17/2017  PT End of Session - 09/17/17 0847    Visit Number  7    Number of Visits  10    Date for PT Re-Evaluation  09/23/17    Authorization Type  BCBS    PT Start Time  0847    PT Stop Time  0936    PT Time Calculation (min)  49 min    Activity Tolerance  Patient tolerated treatment well    Behavior During Therapy  [facility] for tasks assessed/performed       Past Medical History:  Diagnosis Date  . Angiodysplasia of colon   . Basal cell carcinoma of nose   . Chest pain   . Family history of heart disease   . Hyperlipidemia   . Hypertension   . Sleep apnea    CPAP  . Umbilical hernia   . Venereal wart     Past Surgical History:  Procedure Laterality Date  . APPENDECTOMY  1987  . COLONOSCOPY    . HERNIA REPAIR  5945   umbilical  . MOHS SURGERY     nose  . MOUTH SURGERY     wisdom teeth  . TOOTH EXTRACTION      There were no vitals filed for this visit.  Subjective Assessment - 09/17/17 0849    Subjective  Pt doing better - pain less, but notes soreness esp after activities such as mowing the lawn where walking on uneven surface for ~1.5 hrs.    How long can you stand comfortably?  10+ minutes    How long can you walk comfortably?  2+ miles    Patient Stated Goals  Decrease Pain; Able to walk normal; Able to stand at desk    Currently in Pain?  No/denies         [facility] PT Assessment - 09/17/17 0847      AROM   Left Ankle Dorsiflexion  10    Left Ankle Plantar Flexion  49    Left Ankle Inversion  30    Left Ankle Eversion  24      Strength   Left Ankle Dorsiflexion  5/5    Left Ankle Plantar Flexion  5/5    Left Ankle Inversion   4+/5    Left Ankle Eversion  5/5                   [facility] Adult PT Treatment/Exercise - 09/17/17 0847      Exercises   Exercises  Ankle      Modalities   Modalities  Ultrasound      Ultrasound   Ultrasound Location  L Plantar Fascia    Ultrasound Parameters  3.3 MHz, 0.8 W/Cm2, 8 min    Ultrasound Goals  Pain;Other (Comment)      Manual Therapy   Manual Therapy  Soft tissue mobilization;Myofascial release;Passive ROM    Soft tissue mobilization  STM/strumming to L plantar fascia    Myofascial Release  L plantar fascia    Passive ROM  manual stretching to L Plantar Fascia during [place] & STM/MFR      Ankle Exercises: Aerobic   Nustep  L6 x 6' (LE only)  Ankle Exercises: Machines for Strengthening   Cybex Leg Press  Ankle press 20# - B LE x10, L LE x20      Ankle Exercises: Standing   Vector Stance  Left    Vector Stance Limitations  L SLS 4 way clocks on blue foam oval x10    SLS  L SLS + B pallof press with red TB x15    Balance Beam  B side stepping on foam balance beam - toes centered on beam x3, heels centered on beam x3    Other Standing Ankle Exercises  L fwd lunge with toes elevated on blue foam oval x15    Other Standing Ankle Exercises  B side stepping with green TB at forefoot 4 x 20 ft               PT Short Term Goals - 08/25/17 6010      PT SHORT TERM GOAL #1   Title  Independent w/ initial HEP    Status  Achieved        PT Long Term Goals - 09/17/17 9323      PT LONG TERM GOAL #1   Title  Independent w/ ongoing HEP     Status  Partially Met      PT LONG TERM GOAL #2   Title  Pt. will report an increase in standing tolerance to >/= to 45 minutes w/o pain    Status  On-going      PT LONG TERM GOAL #3   Title  Pt. will present with increased ROM in L ankle to [facility] to allow for functional ankle mobility with gait    Status  Achieved      PT LONG TERM GOAL #4   Title  Pt. will demonstrate ability to complete 25 heels raises on L  ankle with good control and symmetrical pattern.    Status  Achieved            Plan - 09/17/17 0847    Clinical Impression Statement  Raymond Howell reporting good improvement in L plantar fascia pain with PT, noting improved walking tolerance - able to walk 2+ miles each morning, but still limited with static standing and reporting increased soreness after prolonged walking on uneven surfaces such as when mowing lawn. Pain also still present initially upon standing after sitting for a while, but quickly subsides with walking. L ankle ROM now WNL and strength 4+/5 to 5/5 but still noting limitations due to fatigue., Crepitus still present in L plantar fasica on palpation but to a lesser degree than at start of POC. Pt demonstrates good potential to benefit from continued PT for further strengthening and proprioceptive training to increase activity tolerance w/o pain limitation.    Rehab Potential  Good    PT Treatment/Interventions  ADLs/Self Care Home Management;Cryotherapy;Dry needling;Iontophoresis 19m/ml Dexamethasone;Ultrasound;Gait training;Therapeutic activities;Therapeutic exercise;Patient/family education;Manual techniques;Taping;Electrical Stimulation;Passive range of motion;Balance training;Neuromuscular re-education    Consulted and Agree with Plan of Care  Patient       Patient will benefit from skilled therapeutic intervention in order to improve the following deficits and impairments:  Pain, Abnormal gait, Decreased activity tolerance, Decreased endurance, Decreased range of motion, Decreased strength, Impaired flexibility, Difficulty walking, Increased fascial restricitons, Decreased balance  Visit Diagnosis: Pain in left foot  Stiffness of left foot, not elsewhere classified  Difficulty in walking, not elsewhere classified     Problem List Patient Active Problem List   Diagnosis Date Noted  . Plantar fasciitis, left  06/26/2017  . Ganglion cyst of right foot 03/03/2017  .  Preventative health care 03/13/2015  . Hyperglycemia 09/05/2013  . Angiodysplasia of cecum 07/12/2013  . Chest pain 04/05/2013  . Obesity (BMI 30-39.9) 03/08/2013  . Obstructive sleep apnea 02/25/2010  . VENEREAL WART 02/02/2007  . BASAL CELL CARCINOMA, NOSE 02/02/2007  . Hyperlipidemia LDL goal <100 02/02/2007  . Essential hypertension 02/02/2007  . HERNIA, UMBILICAL 22/30/0979  . BASAL CELL CARCINOMA, NOSE 02/02/2007    Percival Spanish, PT, MPT 09/17/2017, 9:56 AM  [facility] [address]  [place], [place], [address] Phone: [phone]   Fax:  [phone]  Name: Raymond Howell MRN: [id number] Date of Birth: 25-Apr-1961

## 2017-09-17 NOTE — Assessment & Plan Note (Signed)
Auto settings are working well-few residual events but not many obstructive Call me when weight down to 200 lbs & we can schedule sleep study  Discussed AirFit F30 full facemask  Weight loss encouraged, compliance with goal of at least 4-6 hrs every night is the expectation. Advised against medications with sedative side effects Cautioned against driving when sleepy - understanding that sleepiness will vary on a day to day basis

## 2017-09-17 NOTE — Progress Notes (Signed)
   Subjective:    Patient ID: Raymond Howell, male    DOB: [birth year], 57 y.o.   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  He has been maintained on CPAP since 2011 with good improvement in his daytime somnolence and fatigue. We had to gradually increase his pressure settings to 14 cm and follow-up on his last visit placed him on auto settings 1216 cm. He reports that this is working really well.  No problems with mask or pressure.  He is settled down with use of his full facemask.  He travels a lot.  Download was reviewed and this shows excellent control of events on auto settings with average pressure of 16 cm, residual AHI of 6/hour but obstructive events are only 3.6/hour, and excellent compliance with only one last night.  He has lost weight from 2 47-2 1 9  pounds currently and aims to lose another 20 pounds.   Significant tests/ events reviewed  PSG  2004 (205 lbs )- AHI was 12/ , did not need treatment.  04/2010 PSG (240 lbs ) >>AHI 22/h , lowest desatn 87%, PLMs disappeared with PAP. Central apneas emerged at 11 cm &persisted on BiPAP 13/9.   Review of Systems Patient denies significant dyspnea,cough, hemoptysis,  chest pain, palpitations, pedal edema, orthopnea, paroxysmal nocturnal dyspnea, lightheadedness, nausea, vomiting, abdominal or  leg pains      Objective:   Physical Exam  Gen. Pleasant, obese, in no distress ENT - no lesions, no post nasal drip Neck: No JVD, no thyromegaly, no carotid bruits Lungs: no use of accessory muscles, no dullness to percussion, decreased without rales or rhonchi  Cardiovascular: Rhythm regular, heart sounds  normal, no murmurs or gallops, no peripheral edema Musculoskeletal: No deformities, no cyanosis or clubbing , no tremors       Assessment & Plan:

## 2017-09-17 NOTE — Patient Instructions (Signed)
Auto settings are working well Call me when weight down to 200 lbs & we can schedule sleep study  AirFit F30 full facemask

## 2017-09-22 ENCOUNTER — Ambulatory Visit: Payer: BLUE CROSS/BLUE SHIELD | Admitting: Physical Therapy

## 2017-09-22 ENCOUNTER — Encounter: Payer: Self-pay | Admitting: Physical Therapy

## 2017-09-22 ENCOUNTER — Encounter: Payer: Self-pay | Admitting: Family Medicine

## 2017-09-22 ENCOUNTER — Ambulatory Visit: Payer: BLUE CROSS/BLUE SHIELD | Admitting: Family Medicine

## 2017-09-22 DIAGNOSIS — R262 Difficulty in walking, not elsewhere classified: Secondary | ICD-10-CM | POA: Diagnosis not present

## 2017-09-22 DIAGNOSIS — M722 Plantar fascial fibromatosis: Secondary | ICD-10-CM

## 2017-09-22 DIAGNOSIS — M79672 Pain in left foot: Secondary | ICD-10-CM | POA: Diagnosis not present

## 2017-09-22 DIAGNOSIS — M25675 Stiffness of left foot, not elsewhere classified: Secondary | ICD-10-CM | POA: Diagnosis not present

## 2017-09-22 NOTE — Patient Instructions (Signed)
Continue with the physical therapy - you can transition to a home program when you'd like to. Continue home exercises, stretches - when pain has resolved do them 2-3 times a week for 6 more weeks. Avoid flat shoes/barefoot walking as much as possible. Arch straps have been shown to help with pain. Inserts are important (dr. Zoe Lan active series, spencos, our green insoles, custom orthotics). Follow up with me as needed if you're doing well.

## 2017-09-22 NOTE — Therapy (Addendum)
[facility] [address]  [place], [place], [address] Phone: [phone]   Fax:  [phone]  Physical Therapy Treatment  Patient Details  Name: Raymond Howell MRN: [id number] Date of Birth: 05/07/1961 Referring Provider: Karlton Lemon, MD   Encounter Date: 09/22/2017  PT End of Session - 09/22/17 0922    Visit Number  8    Number of Visits  10    Date for PT Re-Evaluation  10/20/17    Authorization Type  BCBS    PT Start Time  0922    PT Stop Time  1011    PT Time Calculation (min)  49 min    Activity Tolerance  Patient tolerated treatment well    Behavior During Therapy  [facility] for tasks assessed/performed       Past Medical History:  Diagnosis Date  . Angiodysplasia of colon   . Basal cell carcinoma of nose   . Chest pain   . Family history of heart disease   . Hyperlipidemia   . Hypertension   . Sleep apnea    CPAP  . Umbilical hernia   . Venereal wart     Past Surgical History:  Procedure Laterality Date  . APPENDECTOMY  1987  . COLONOSCOPY    . HERNIA REPAIR  3532   umbilical  . MOHS SURGERY     nose  . MOUTH SURGERY     wisdom teeth  . TOOTH EXTRACTION      There were no vitals filed for this visit.  Subjective Assessment - 09/22/17 0925    Subjective  Pt reports he has been released from Dr. Barbaraann Barthel but can continue PT as he feels like it is helping. Will be traveling for the next 2 weeks for work but would like to return for at least 1 visit at that time. Able to walk long distances while in [place] over the weekend w/o issue while walking or the next day.    Patient Stated Goals  Decrease Pain; Able to walk normal; Able to stand at desk    Currently in Pain?  Yes    Pain Score  -- 1-2/10 currently, up to 2-3/10 at worst after period of inactivity    Pain Location  Foot    Pain Orientation  Left    Pain Descriptors / Indicators  Sore    Pain Type  Acute pain                        [facility] Adult PT Treatment/Exercise - 09/22/17 0922      Exercises   Exercises  Ankle      Modalities   Modalities  Ultrasound      Ultrasound   Ultrasound Location  L Plantar Fascia    Ultrasound Parameters  3.3 MHz, 0.8 W/Cm2, 8 min    Ultrasound Goals  Pain;Other (Comment)      Manual Therapy   Manual Therapy  Soft tissue mobilization;Myofascial release;Passive ROM    Manual therapy comments  prone    Soft tissue mobilization  STM/strumming to L plantar fascia    Myofascial Release  L plantar fascia    Passive ROM  manual stretching to L Plantar Fascia during [place] & STM/MFR      Ankle Exercises: Aerobic   Recumbent Bike  L3 x 6'       Ankle Exercises: Machines for Strengthening   Cybex Leg Press  Ankle press  25# - L LE x15      Ankle Exercises: Standing   SLS  L SLS on blue foam oval + moving ball toss x 60 sec    Rocker Board Limitations  A/P weight shift on inverted BOSU x20; BOSU squat x15    Heel Raises  Left;20 reps;3 seconds    Heel Raises Limitations  at UBE, L Eccentric lowering    Balance Beam  Tandem gait with lateral cone knock down; B sIdestepping with cone knock down & righting; B side stepping on foam balance beam - toes centered on beam x3, heels centered on beam x3    Other Standing Ankle Exercises  Resisted gait fwd x 180 ft               PT Short Term Goals - 08/25/17 2703      PT SHORT TERM GOAL #1   Title  Independent w/ initial HEP    Status  Achieved        PT Long Term Goals - 09/22/17 0930      PT LONG TERM GOAL #1   Title  Independent w/ ongoing HEP     Status  Partially Met    Target Date  10/20/17      PT LONG TERM GOAL #2   Title  Pt. will report an increase in standing tolerance to >/= to 45 minutes w/o pain    Status  On-going    Target Date  10/20/17      PT LONG TERM GOAL #3   Title  Pt. will present with increased ROM in L ankle to [facility] to allow for functional ankle mobility with gait     Status  Achieved      PT LONG TERM GOAL #4   Title  Pt. will demonstrate ability to complete 25 heels raises on L ankle with good control and symmetrical pattern.    Status  Achieved            Plan - 09/22/17 0929    Clinical Impression Statement  [name] reporting Dr. Barbaraann Barthel pleased with his progress and has released him, but left it up to the pt with continuing PT - 2 approved visits remaining in existing POC and pt wishing to complete these but will reduce frequency to 1x/wk or every other week as pt will be traveling extensively in the next few weeks, therefore extended recert date for 4 weeks from today. Therapeutic activities continue to focus on balance. propriocpetion and foot instrinsic strengthening with good tolerance but moderate to significant unsteadiness with some balance activities on uneven surfaces.    Rehab Potential  Good    PT Treatment/Interventions  ADLs/Self Care Home Management;Cryotherapy;Dry needling;Iontophoresis 24m/ml Dexamethasone;Ultrasound;Gait training;Therapeutic activities;Therapeutic exercise;Patient/family education;Manual techniques;Taping;Electrical Stimulation;Passive range of motion;Balance training;Neuromuscular re-education    Consulted and Agree with Plan of Care  Patient       Patient will benefit from skilled therapeutic intervention in order to improve the following deficits and impairments:  Pain, Abnormal gait, Decreased activity tolerance, Decreased endurance, Decreased range of motion, Decreased strength, Impaired flexibility, Difficulty walking, Increased fascial restricitons, Decreased balance  Visit Diagnosis: Pain in left foot  Stiffness of left foot, not elsewhere classified  Difficulty in walking, not elsewhere classified     Problem List Patient Active Problem List   Diagnosis Date Noted  . Plantar fasciitis, left 06/26/2017  . Ganglion cyst of right foot 03/03/2017  . Preventative health care 03/13/2015  .  Hyperglycemia 09/05/2013  .  Angiodysplasia of cecum 07/12/2013  . Chest pain 04/05/2013  . Obesity (BMI 30-39.9) 03/08/2013  . Obstructive sleep apnea 02/25/2010  . VENEREAL WART 02/02/2007  . BASAL CELL CARCINOMA, NOSE 02/02/2007  . Hyperlipidemia LDL goal <100 02/02/2007  . Essential hypertension 02/02/2007  . HERNIA, UMBILICAL 45/36/4680  . BASAL CELL CARCINOMA, NOSE 02/02/2007    Percival Spanish, PT, MPT 09/22/2017, 10:20 AM  [facility] [address]  [place], [place], [address] Phone: [phone]   Fax:  [phone]  Name: KEION NEELS MRN: [id number] Date of Birth: Aug 19, 1960   PHYSICAL THERAPY DISCHARGE SUMMARY  Visits from Start of Care: 8  Current functional level related to goals / functional outcomes:    Refer to above clinical impression for status as of last visit on 09/22/17. Pt failed to return for remaining visits in POC, therefore will proceed with discharge from PT for this episode. Unable to formally assess status at discharge due to failure to return.   Remaining deficits:   As above.   Education / Equipment:   HEP  Plan: Patient agrees to discharge.  Patient goals were partially met. Patient is being discharged due to not returning since the last visit.  ?????     Percival Spanish, PT, MPT 11/18/17, 9:39 AM  [facility] [address]  [place], [place], [address] Phone: [phone]   Fax:  [phone]

## 2017-09-22 NOTE — Progress Notes (Signed)
PCP and consultation requested by: [name], DO  Subjective:   HPI: Patient is a 57 y.o. male here for follow-up of left heel pain.  2/15: Patient denies known injury or trauma. He states about 3-4 months ago he started to feel pain in plantar left heel. Pain worse in the morning, better during the day. Feels like a bruise on bottom of heel. Currently 0/10 level. Has been icing. Tried some OTC inserts but made shoes too tight. No skin changes, numbness.  3/29: Patient reports hs feels he's mildly improved since last visit. Still with pain at 4-5/10 with prolonged standing, immobility, driving. Has good and bad days. Using arch supports; scaphoid pads in dress shoes. Using arch binders. Doing home exercises. Walks 2 1/2 miles daily and feels better with this. No skin changes, numbness.  5/14: Patient reports continued improvement since his last visit. Pain is located at the medial plantar portion of his heel, 2-3/10 in severity. Pain is present in the mornings and after extended periods of sitting, such as after driving.  Stretching each morning and performing daily exercises at home.  Attends PT 1-2x per week.  Still using arch binders. Applying ice every other day on average. Not taking any OTC pain medications. Has not required night splints. Still walking 2.5 miles every morning and notes that the pain is manageable. Denies swelling, skin changes, and numbness.   Past Medical History:  Diagnosis Date  . Angiodysplasia of colon   . Basal cell carcinoma of nose   . Chest pain   . Family history of heart disease   . Hyperlipidemia   . Hypertension   . Sleep apnea    CPAP  . Umbilical hernia   . Venereal wart     Current Outpatient Medications on File Prior to Visit  Medication Sig Dispense Refill  . aspirin 81 MG tablet Take 81 mg by mouth daily.      . Flaxseed, Linseed, (FLAXSEED OIL) 1000 MG CAPS Take by mouth 2 (two) times daily.      .  metoprolol succinate (TOPROL-XL) 100 MG 24 hr tablet Take 1 tablet (100 mg total) by mouth daily. 90 tablet 2  . Multiple Vitamin (MULTIVITAMIN) tablet Take 1 tablet by mouth daily.      . niacin (NIASPAN) 1000 MG CR tablet   1  . Niacin CR 1000 MG TBCR Take 1 tablet (1,000 mg total) by mouth daily. 90 each 2  . NON FORMULARY at bedtime. CPAP    . ranitidine (ZANTAC) 150 MG capsule Take 1 capsule (150 mg total) by mouth 2 (two) times daily. (Patient taking differently: Take 150 mg by mouth as needed. ) 60 capsule 0  . simvastatin (ZOCOR) 40 MG tablet Take 1 tablet (40 mg total) by mouth at bedtime. 90 tablet 2   No current facility-administered medications on file prior to visit.     Past Surgical History:  Procedure Laterality Date  . APPENDECTOMY  1987  . COLONOSCOPY    . HERNIA REPAIR  0240   umbilical  . MOHS SURGERY     nose  . MOUTH SURGERY     wisdom teeth  . TOOTH EXTRACTION      No Known Allergies  Social History   Socioeconomic History  . Marital status: Married    Spouse name: Not on file  . Number of children: Not on file  . Years of education: Not on file  . Highest education level: Not on file  Occupational History  . Not on file  Social Needs  . Financial resource strain: Not on file  . Food insecurity:    Worry: Not on file    Inability: Not on file  . Transportation needs:    Medical: Not on file    Non-medical: Not on file  Tobacco Use  . Smoking status: Never Smoker  . Smokeless tobacco: Never Used  Substance and Sexual Activity  . Alcohol use: Yes    Alcohol/week: 1.5 - 2.0 oz    Types: 3 - 4 Standard drinks or equivalent per week    Comment: per week  . Drug use: No  . Sexual activity: Yes    Partners: Female  Lifestyle  . Physical activity:    Days per week: Not on file    Minutes per session: Not on file  . Stress: Not on file  Relationships  . Social connections:    Talks on phone: Not on file    Gets together: Not on file     Attends religious service: Not on file    Active member of club or organization: Not on file    Attends meetings of clubs or organizations: Not on file    Relationship status: Not on file  . Intimate partner violence:    Fear of current or ex partner: Not on file    Emotionally abused: Not on file    Physically abused: Not on file    Forced sexual activity: Not on file  Other Topics Concern  . Not on file  Social History Narrative   Reg exercise--- no    Family History  Problem Relation Age of Onset  . Heart disease Father 62       MI died @ 2  . Heart attack Maternal Grandfather 42  . Coronary artery disease Other        1st degree relative  . Cancer Other        skin  . Colon cancer Neg Hx   . Stomach cancer Neg Hx     BP 122/79   Pulse (!) 54   Ht 5\' 10"  (1.778 m)   Wt 219 lb (99.3 kg)   BMI 31.42 kg/m   Review of Systems: See HPI above.     Objective:  Physical Exam:  Gen: well-appearing, NAD CV: warm, well-perfused Pulm: non-labored breathing Left foot/ankle:  No gross deformity, swelling, ecchymoses FROM with 5/5 strength without pain. TTP plantar fascia at insertion on calcaneus but mild. Negative ant drawer and talar tilt.   Negative syndesmotic compression. Thompsons test negative. NV intact distally.  Assessment & Plan:  1. Left plantar fasciitis: clinically improving.  Continue with physical therapy, home exercises, stretches.  Arch binders, inserts.  F/u prn.  Tylenol, ibuprofen only if needed.

## 2017-09-23 ENCOUNTER — Encounter: Payer: Self-pay | Admitting: Family Medicine

## 2017-09-23 NOTE — Assessment & Plan Note (Signed)
clinically improving.  Continue with physical therapy, home exercises, stretches.  Arch binders, inserts.  F/u prn.  Tylenol, ibuprofen only if needed.

## 2017-10-12 ENCOUNTER — Ambulatory Visit: Payer: BLUE CROSS/BLUE SHIELD

## 2017-10-13 ENCOUNTER — Encounter: Payer: BLUE CROSS/BLUE SHIELD | Admitting: Physical Therapy

## 2017-10-14 ENCOUNTER — Encounter: Payer: BLUE CROSS/BLUE SHIELD | Admitting: Physical Therapy

## 2017-11-11 ENCOUNTER — Encounter: Payer: Self-pay | Admitting: Family Medicine

## 2017-12-03 ENCOUNTER — Other Ambulatory Visit (INDEPENDENT_AMBULATORY_CARE_PROVIDER_SITE_OTHER): Payer: BLUE CROSS/BLUE SHIELD

## 2017-12-03 DIAGNOSIS — I10 Essential (primary) hypertension: Secondary | ICD-10-CM

## 2017-12-03 DIAGNOSIS — E782 Mixed hyperlipidemia: Secondary | ICD-10-CM

## 2017-12-03 DIAGNOSIS — R739 Hyperglycemia, unspecified: Secondary | ICD-10-CM

## 2017-12-03 LAB — LIPID PANEL
CHOL/HDL RATIO: 3
CHOLESTEROL: 123 mg/dL (ref 0–200)
HDL: 46.8 mg/dL (ref >39.00)
LDL Cholesterol: 55 mg/dL (ref 0–99)
NonHDL: 76.59
TRIGLYCERIDES: 110 mg/dL (ref 0.0–149.0)
VLDL: 22 mg/dL (ref 0.0–40.0)

## 2017-12-03 LAB — COMPREHENSIVE METABOLIC PANEL
ALBUMIN: 4.3 g/dL (ref 3.5–5.2)
ALT: 24 U/L (ref 0–53)
AST: 25 U/L (ref 0–37)
Alkaline Phosphatase: 49 U/L (ref 39–117)
BUN: 15 mg/dL (ref 6–23)
CALCIUM: 9.5 mg/dL (ref 8.4–10.5)
CHLORIDE: 104 meq/L (ref 96–112)
CO2: 28 mEq/L (ref 19–32)
Creatinine, Ser: 0.87 mg/dL (ref 0.40–1.50)
GFR: 95.75 mL/min (ref >60.00)
Glucose, Bld: 116 mg/dL — ABNORMAL HIGH (ref 70–99)
Potassium: 4.1 mEq/L (ref 3.5–5.1)
Sodium: 139 mEq/L (ref 135–145)
Total Bilirubin: 1.5 mg/dL — ABNORMAL HIGH (ref 0.2–1.2)
Total Protein: 6.7 g/dL (ref 6.0–8.3)

## 2017-12-03 LAB — HEMOGLOBIN A1C: Hgb A1c MFr Bld: 4.9 % (ref 4.6–6.5)

## 2017-12-04 ENCOUNTER — Other Ambulatory Visit: Payer: BLUE CROSS/BLUE SHIELD

## 2018-02-03 DIAGNOSIS — N400 Enlarged prostate without lower urinary tract symptoms: Secondary | ICD-10-CM | POA: Diagnosis not present

## 2018-02-17 ENCOUNTER — Ambulatory Visit (INDEPENDENT_AMBULATORY_CARE_PROVIDER_SITE_OTHER): Payer: BLUE CROSS/BLUE SHIELD

## 2018-02-17 DIAGNOSIS — Z23 Encounter for immunization: Secondary | ICD-10-CM | POA: Diagnosis not present

## 2018-04-20 ENCOUNTER — Ambulatory Visit (INDEPENDENT_AMBULATORY_CARE_PROVIDER_SITE_OTHER): Payer: BLUE CROSS/BLUE SHIELD | Admitting: Family Medicine

## 2018-04-20 ENCOUNTER — Encounter: Payer: Self-pay | Admitting: Family Medicine

## 2018-04-20 VITALS — BP 116/76 | HR 50 | Temp 98.0°F | Resp 16 | Ht 70.0 in | Wt 231.4 lb

## 2018-04-20 DIAGNOSIS — I10 Essential (primary) hypertension: Secondary | ICD-10-CM

## 2018-04-20 DIAGNOSIS — E785 Hyperlipidemia, unspecified: Secondary | ICD-10-CM | POA: Diagnosis not present

## 2018-04-20 DIAGNOSIS — Z0000 Encounter for general adult medical examination without abnormal findings: Secondary | ICD-10-CM

## 2018-04-20 MED ORDER — NIACIN ER 1000 MG PO TBCR: 1.0000 | EXTENDED_RELEASE_TABLET | Freq: Every day | ORAL | 2 refills | Status: DC

## 2018-04-20 MED ORDER — SIMVASTATIN 40 MG PO TABS: 40.0000 mg | ORAL_TABLET | Freq: Every day | ORAL | 2 refills | Status: DC

## 2018-04-20 MED ORDER — METOPROLOL SUCCINATE ER 100 MG PO TB24: 100.0000 mg | ORAL_TABLET | Freq: Every day | ORAL | 2 refills | Status: DC

## 2018-04-20 NOTE — Patient Instructions (Signed)

## 2018-04-20 NOTE — Progress Notes (Signed)
Patient ID: Raymond Howell, male    DOB: 04/02/1961  Age: 57 y.o. MRN: [id number]    Subjective:  Subjective  HPI Raymond Howell presents for cpe   No complaints   Review of Systems  Constitutional: Negative for appetite change, chills, diaphoresis, fatigue, fever and unexpected weight change.  HENT: Negative for congestion and hearing loss.   Eyes: Negative for pain, discharge, redness and visual disturbance.  Respiratory: Negative for cough, chest tightness, shortness of breath and wheezing.   Cardiovascular: Negative for chest pain, palpitations and leg swelling.  Gastrointestinal: Negative for abdominal pain, blood in stool, constipation, diarrhea, nausea and vomiting.  Endocrine: Negative for cold intolerance, heat intolerance, polydipsia, polyphagia and polyuria.  Genitourinary: Negative for difficulty urinating, dysuria, frequency, hematuria and urgency.  Musculoskeletal: Negative for back pain and myalgias.  Skin: Negative for rash.  Allergic/Immunologic: Negative for environmental allergies.  Neurological: Negative for dizziness, weakness, light-headedness, numbness and headaches.  Hematological: Does not bruise/bleed easily.  Psychiatric/Behavioral: Negative for suicidal ideas. The patient is not nervous/anxious.     History Past Medical History:  Diagnosis Date  . Angiodysplasia of colon   . Basal cell carcinoma of nose   . Chest pain   . Family history of heart disease   . Hyperlipidemia   . Hypertension   . Sleep apnea    CPAP  . Umbilical hernia   . Venereal wart     He has a past surgical history that includes Hernia repair (2004); Mohs surgery; Mouth surgery; Appendectomy (1987); Colonoscopy; and Tooth extraction.   His family history includes Cancer in his other; Coronary artery disease in his other; Heart attack (age of onset: 36) in his maternal grandfather; Heart disease (age of onset: 60) in his father.He reports that he has never smoked. He has never used  smokeless tobacco. He reports that he drinks about 3.0 - 4.0 standard drinks of alcohol per week. He reports that he does not use drugs.  Current Outpatient Medications on File Prior to Visit  Medication Sig Dispense Refill  . aspirin 81 MG tablet Take 81 mg by mouth daily.      . Flaxseed, Linseed, (FLAXSEED OIL) 1000 MG CAPS Take by mouth 2 (two) times daily.      . Multiple Vitamin (MULTIVITAMIN) tablet Take 1 tablet by mouth daily.      . NON FORMULARY at bedtime. CPAP     No current facility-administered medications on file prior to visit.      Objective:  Objective  Physical Exam  Constitutional: He is oriented to person, place, and time. He appears well-developed and well-nourished. No distress.  HENT:  Head: Normocephalic and atraumatic.  Right Ear: External ear normal.  Left Ear: External ear normal.  Nose: Nose normal.  Mouth/Throat: Oropharynx is clear and moist. No oropharyngeal exudate.  Eyes: Pupils are equal, round, and reactive to light. Conjunctivae and EOM are normal. Right eye exhibits no discharge. Left eye exhibits no discharge.  Neck: Normal range of motion. Neck supple. No JVD present. No thyromegaly present.  Cardiovascular: Normal rate, regular rhythm, normal heart sounds and intact distal pulses.  No murmur heard. Pulmonary/Chest: Effort normal and breath sounds normal. No respiratory distress. He has no wheezes. He has no rales. He exhibits no tenderness.  Abdominal: Soft. Bowel sounds are normal. He exhibits no distension and no mass. There is no tenderness. There is no rebound and no guarding.  Genitourinary: Prostate normal and penis normal.  Musculoskeletal: Normal range of  motion. He exhibits no edema or tenderness.  Lymphadenopathy:    He has no cervical adenopathy.  Neurological: He is alert and oriented to person, place, and time. He has normal reflexes. He displays normal reflexes. No cranial nerve deficit. He exhibits normal muscle tone.  Skin:  Skin is warm and dry. No rash noted. He is not diaphoretic. No erythema.  Psychiatric: He has a normal mood and affect. His behavior is normal. Judgment and thought content normal.  Nursing note and vitals reviewed.  BP 116/76 (BP Location: Left Arm, Cuff Size: Large)   Pulse (!) 50   Temp 98 F (36.7 C) (Oral)   Resp 16   Ht 5\' 10"  (1.778 m)   Wt 231 lb 6.4 oz (105 kg)   SpO2 97%   BMI 33.20 kg/m  Wt Readings from Last 3 Encounters:  04/20/18 231 lb 6.4 oz (105 kg)  09/22/17 219 lb (99.3 kg)  09/17/17 219 lb (99.3 kg)     Lab Results  Component Value Date   WBC 7.9 04/20/2018   HGB 16.0 04/20/2018   HCT 45.3 04/20/2018   PLT 202.0 04/20/2018   GLUCOSE 91 04/20/2018   CHOL 128 04/20/2018   TRIG 168.0 (H) 04/20/2018   HDL 46.50 04/20/2018   LDLCALC 48 04/20/2018   ALT 28 04/20/2018   AST 32 04/20/2018   NA 140 04/20/2018   K 4.2 04/20/2018   CL 102 04/20/2018   CREATININE 0.82 04/20/2018   BUN 19 04/20/2018   CO2 31 04/20/2018   TSH 1.61 04/20/2018   PSA 1.5 03/18/2016   HGBA1C 4.9 12/03/2017   MICROALBUR <0.7 03/18/2016    Dg Foot Complete Right  Result Date: 02/24/2017 CLINICAL DATA:  RT foot dorsal ganglion of mid foot at base of 2nd MT npticed about 3-4 months ago but has grown. Hyperechoic area on Ultrasound assess for foreign body. No old or recent injury EXAM: RIGHT FOOT COMPLETE - 3+ VIEW COMPARISON:  None. FINDINGS: No fracture or dislocation of mid foot or forefoot. The phalanges are normal. The calcaneus is normal. Enthesopathic spurring along the plantar aspect of the calcaneus. No soft tissue abnormality. IMPRESSION: No acute osseous abnormality. Electronically Signed   By: Suzy Bouchard M.D.   On: 02/24/2017 16:16     Assessment & Plan:  Plan  I have discontinued Raymond Howell "[name]"'s ranitidine and niacin. I am also having him maintain his aspirin, Flaxseed Oil, multivitamin, NON FORMULARY, simvastatin, metoprolol succinate, and Niacin  CR.  Meds ordered this encounter  Medications  . simvastatin (ZOCOR) 40 MG tablet    Sig: Take 1 tablet (40 mg total) by mouth at bedtime.    Dispense:  90 tablet    Refill:  2  . metoprolol succinate (TOPROL-XL) 100 MG 24 hr tablet    Sig: Take 1 tablet (100 mg total) by mouth daily.    Dispense:  90 tablet    Refill:  2  . Niacin CR 1000 MG TBCR    Sig: Take 1 tablet (1,000 mg total) by mouth daily.    Dispense:  90 each    Refill:  2    Problem List Items Addressed This Visit      Unprioritized   Essential hypertension    Well controlled, no changes to meds. Encouraged heart healthy diet such as the DASH diet and exercise as tolerated.       Relevant Medications   simvastatin (ZOCOR) 40 MG tablet   metoprolol succinate (TOPROL-XL)  100 MG 24 hr tablet   Niacin CR 1000 MG TBCR   Other Relevant Orders   CBC with Differential/Platelet (Completed)   TSH (Completed)   Hyperlipidemia    Tolerating statin, encouraged heart healthy diet, avoid trans fats, minimize simple carbs and saturated fats. Increase exercise as tolerated      Relevant Medications   simvastatin (ZOCOR) 40 MG tablet   metoprolol succinate (TOPROL-XL) 100 MG 24 hr tablet   Niacin CR 1000 MG TBCR   Other Relevant Orders   Comprehensive metabolic panel (Completed)   Lipid panel (Completed)   CBC with Differential/Platelet (Completed)   TSH (Completed)   Preventative health care - Primary    ghm utd Check labs  See AVS      Relevant Orders   CBC with Differential/Platelet (Completed)   TSH (Completed)      Follow-up: Return in about 6 months (around 10/20/2018) for hypertension, hyperlipidemia.  [name], DO

## 2018-04-21 LAB — COMPREHENSIVE METABOLIC PANEL
ALK PHOS: 49 U/L (ref 39–117)
ALT: 28 U/L (ref 0–53)
AST: 32 U/L (ref 0–37)
Albumin: 4.8 g/dL (ref 3.5–5.2)
BUN: 19 mg/dL (ref 6–23)
CO2: 31 meq/L (ref 19–32)
Calcium: 9.8 mg/dL (ref 8.4–10.5)
Chloride: 102 mEq/L (ref 96–112)
Creatinine, Ser: 0.82 mg/dL (ref 0.40–1.50)
GFR: 102.38 mL/min (ref >60.00)
GLUCOSE: 91 mg/dL (ref 70–99)
Potassium: 4.2 mEq/L (ref 3.5–5.1)
SODIUM: 140 meq/L (ref 135–145)
TOTAL PROTEIN: 6.9 g/dL (ref 6.0–8.3)
Total Bilirubin: 1.3 mg/dL — ABNORMAL HIGH (ref 0.2–1.2)

## 2018-04-21 LAB — CBC WITH DIFFERENTIAL/PLATELET
BASOS PCT: 0.8 % (ref 0.0–3.0)
Basophils Absolute: 0.1 10*3/uL (ref 0.0–0.1)
EOS PCT: 2.1 % (ref 0.0–5.0)
Eosinophils Absolute: 0.2 10*3/uL (ref 0.0–0.7)
HCT: 45.3 % (ref 39.0–52.0)
HEMOGLOBIN: 16 g/dL (ref 13.0–17.0)
LYMPHS PCT: 23.8 % (ref 12.0–46.0)
Lymphs Abs: 1.9 10*3/uL (ref 0.7–4.0)
MCHC: 35.4 g/dL (ref 30.0–36.0)
MCV: 93.5 fl (ref 78.0–100.0)
Monocytes Absolute: 0.7 10*3/uL (ref 0.1–1.0)
Monocytes Relative: 9.2 % (ref 3.0–12.0)
NEUTROS ABS: 5.1 10*3/uL (ref 1.4–7.7)
NEUTROS PCT: 64.1 % (ref 43.0–77.0)
PLATELETS: 202 10*3/uL (ref 150.0–400.0)
RBC: 4.85 Mil/uL (ref 4.22–5.81)
RDW: 13 % (ref 11.5–15.5)
WBC: 7.9 10*3/uL (ref 4.0–10.5)

## 2018-04-21 LAB — TSH: TSH: 1.61 u[IU]/mL (ref 0.35–4.50)

## 2018-04-21 LAB — LIPID PANEL
CHOL/HDL RATIO: 3
Cholesterol: 128 mg/dL (ref 0–200)
HDL: 46.5 mg/dL (ref >39.00)
LDL Cholesterol: 48 mg/dL (ref 0–99)
NonHDL: 81.88
Triglycerides: 168 mg/dL — ABNORMAL HIGH (ref 0.0–149.0)
VLDL: 33.6 mg/dL (ref 0.0–40.0)

## 2018-04-21 NOTE — Assessment & Plan Note (Signed)
ghm utd Check labs See AVS 

## 2018-04-21 NOTE — Assessment & Plan Note (Signed)
Tolerating statin, encouraged heart healthy diet, avoid trans fats, minimize simple carbs and saturated fats. Increase exercise as tolerated 

## 2018-04-21 NOTE — Assessment & Plan Note (Signed)
Well controlled, no changes to meds. Encouraged heart healthy diet such as the DASH diet and exercise as tolerated.  °

## 2018-05-18 DIAGNOSIS — Z85828 Personal history of other malignant neoplasm of skin: Secondary | ICD-10-CM | POA: Diagnosis not present

## 2018-05-18 DIAGNOSIS — L814 Other melanin hyperpigmentation: Secondary | ICD-10-CM | POA: Diagnosis not present

## 2018-05-18 DIAGNOSIS — D225 Melanocytic nevi of trunk: Secondary | ICD-10-CM | POA: Diagnosis not present

## 2018-05-18 DIAGNOSIS — L570 Actinic keratosis: Secondary | ICD-10-CM | POA: Diagnosis not present

## 2018-05-18 DIAGNOSIS — L821 Other seborrheic keratosis: Secondary | ICD-10-CM | POA: Diagnosis not present

## 2018-09-24 IMAGING — US US ABDOMEN COMPLETE
1 series · 14 of 25 positions shown · non-contrast
Comparison: None.

CLINICAL DATA: Abdominal pain and decreased appetite for 1 week

EXAM:
ABDOMEN ULTRASOUND COMPLETE

[Series 1: us abdomen complete · 0.21mm/px · 14 of 91 slices shown]
[im 1/91]
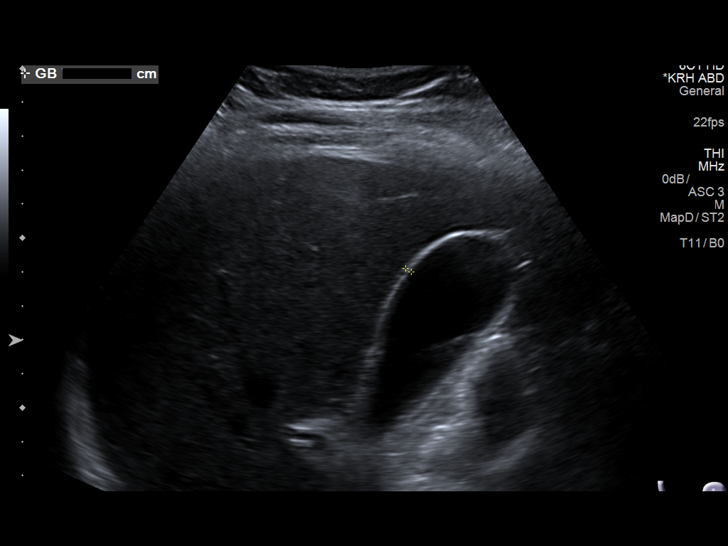
[im 8/91]
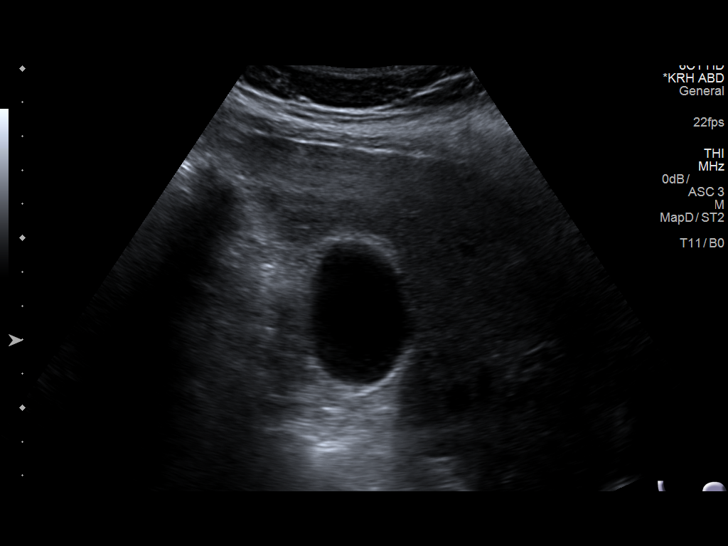
[im 16/91]
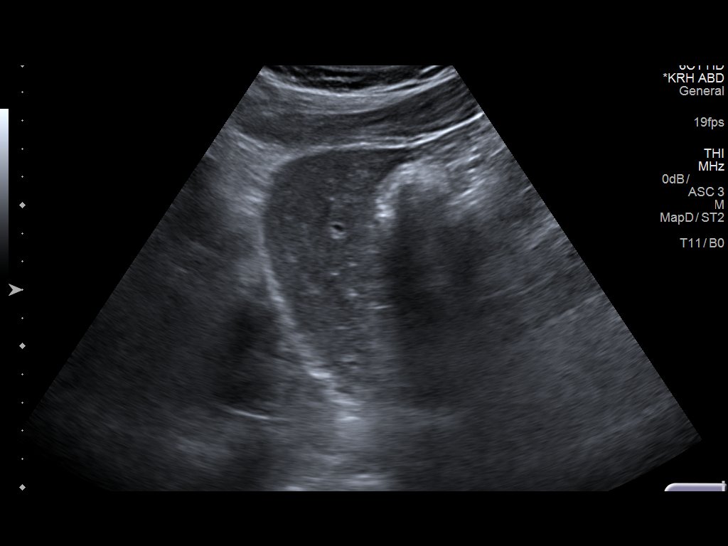
[im 23/91]
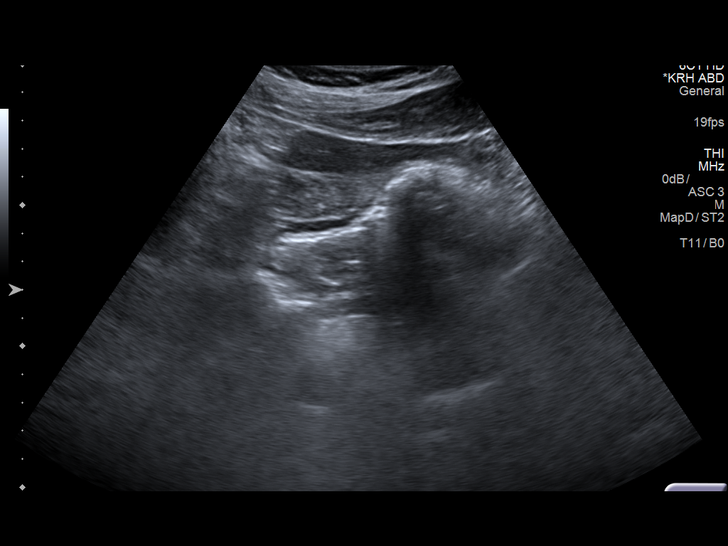
[im 31/91]
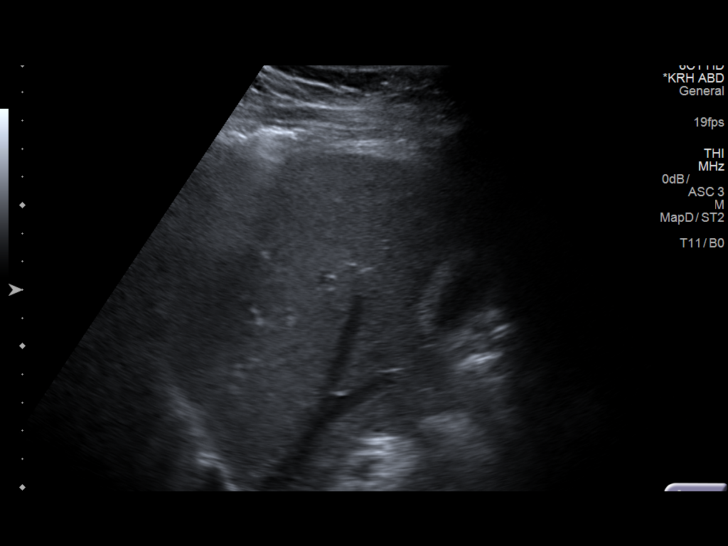
[im 34/91]
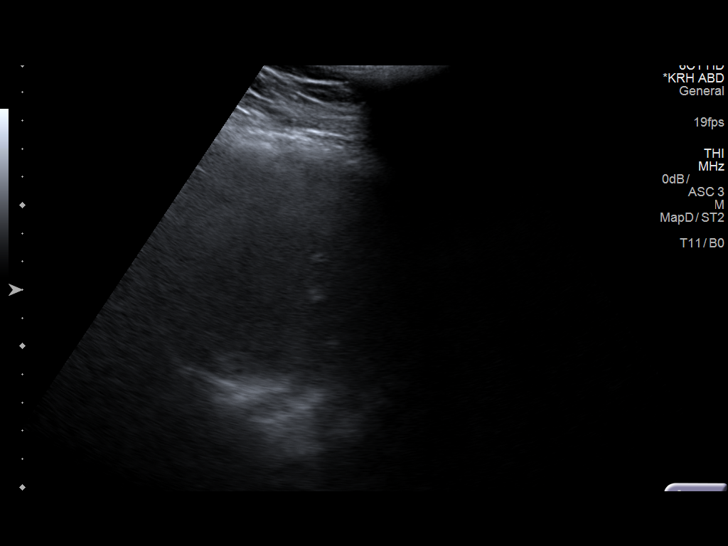
[im 42/91]
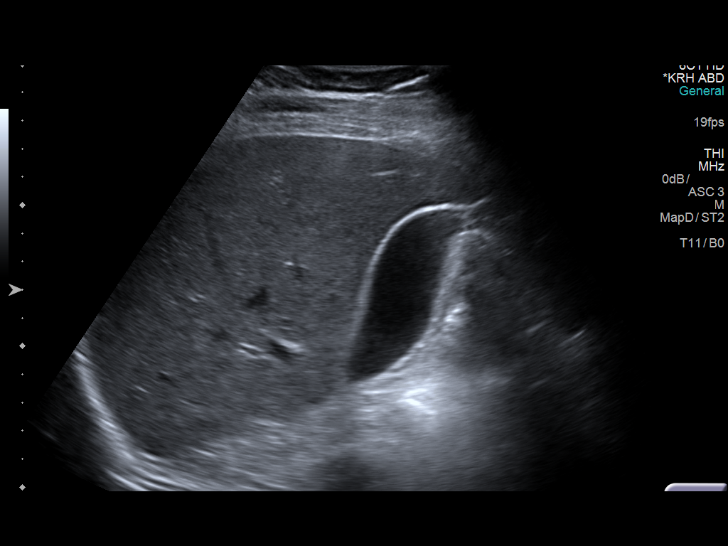
[im 49/91]
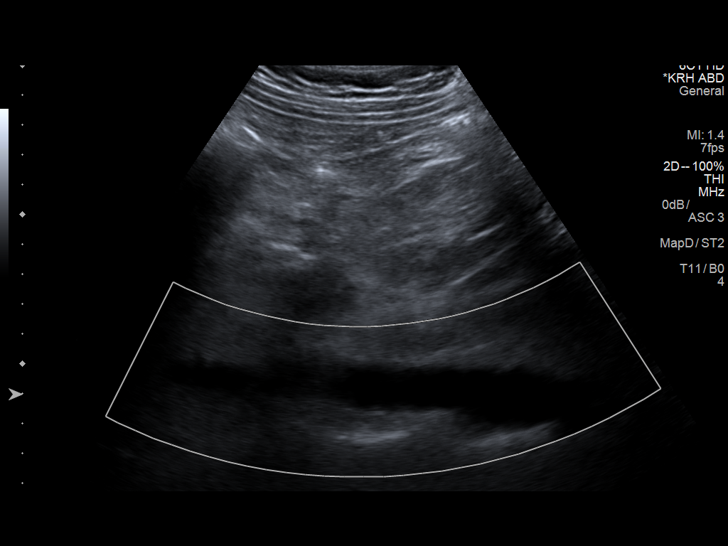
[im 57/91]
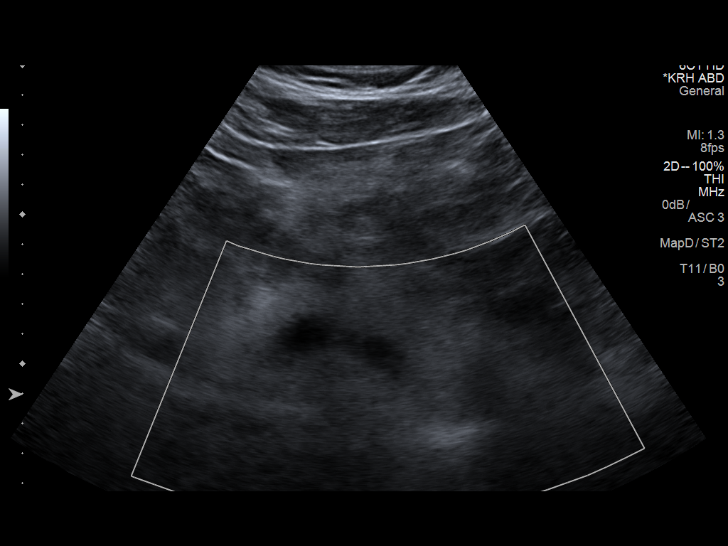
[im 61/91]
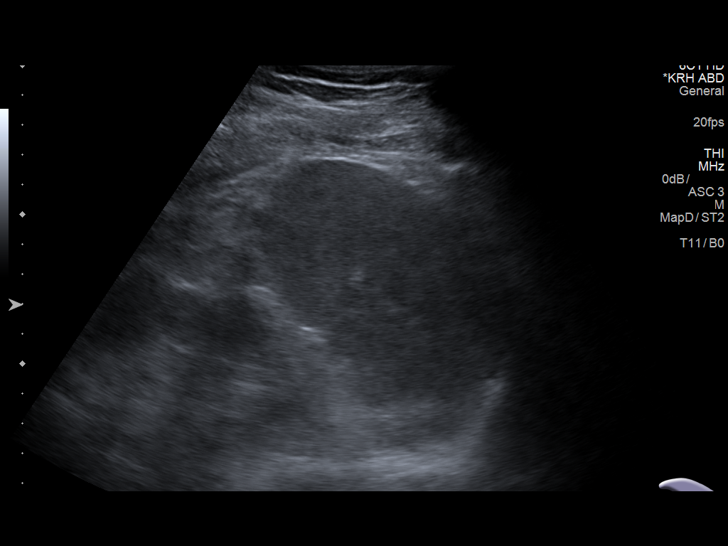
[im 68/91]
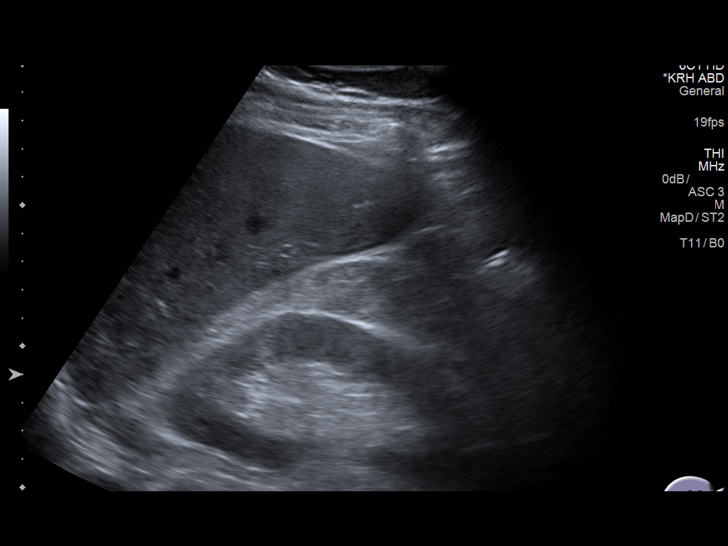
[im 76/91]
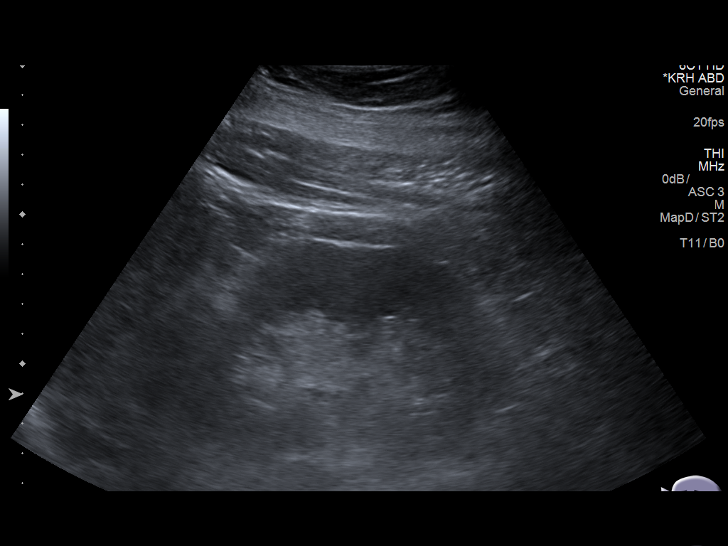
[im 83/91]
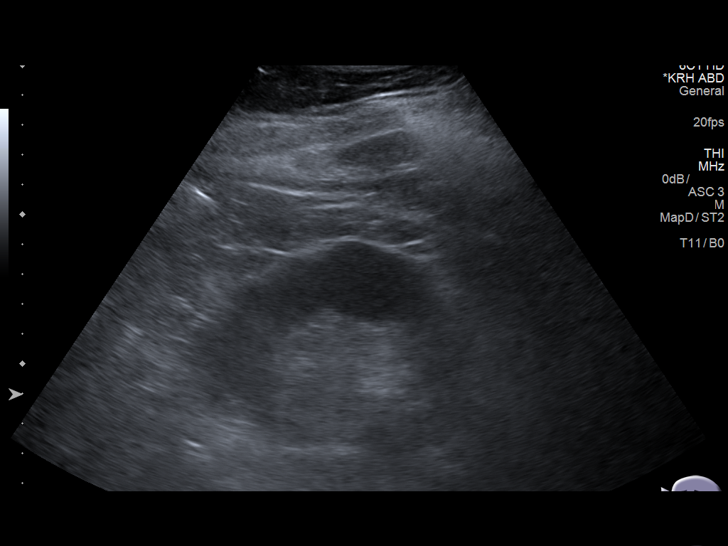
[im 91/91]
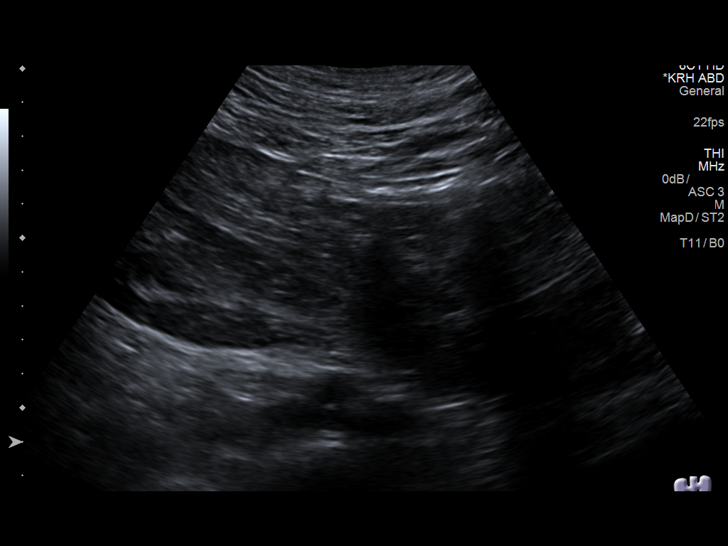

[14 of 25 positions shown; findings below may reference images not displayed]

FINDINGS: Gallbladder: No gallstones or wall thickening visualized. There is
no pericholecystic fluid. No sonographic Murphy sign noted by
sonographer.

Common bile duct: Diameter: 4 mm. There is no intrahepatic, common
hepatic, or common bile duct dilatation.

Liver: No focal lesion identified. Within normal limits in
parenchymal echogenicity.

IVC: No abnormality visualized.

Pancreas: No mass or inflammatory focus.

Spleen: Size and appearance within normal limits.

Right Kidney: Length: 11.9 cm. Echogenicity within normal limits. No
mass or hydronephrosis visualized.

Left Kidney: Length: 12.2 cm. Echogenicity within normal limits. No
mass or hydronephrosis visualized.

Abdominal aorta: No aneurysm visualized.

Other findings: No demonstrable ascites.
IMPRESSION: Study within normal limits.

## 2019-02-03 ENCOUNTER — Ambulatory Visit (INDEPENDENT_AMBULATORY_CARE_PROVIDER_SITE_OTHER): Payer: BC Managed Care – PPO

## 2019-02-03 ENCOUNTER — Other Ambulatory Visit: Payer: Self-pay

## 2019-02-03 DIAGNOSIS — Z23 Encounter for immunization: Secondary | ICD-10-CM | POA: Diagnosis not present

## 2019-02-03 NOTE — Progress Notes (Signed)
Here for flu shot

## 2019-02-10 DIAGNOSIS — N400 Enlarged prostate without lower urinary tract symptoms: Secondary | ICD-10-CM | POA: Diagnosis not present

## 2019-02-11 ENCOUNTER — Telehealth: Payer: Self-pay | Admitting: Family Medicine

## 2019-02-11 NOTE — Telephone Encounter (Signed)
LEFT VM TO [facility] CPE FOR DEC. LEFT MSG TO LET Patient know only afternoon appointments left this year

## 2019-02-19 ENCOUNTER — Encounter: Payer: Self-pay | Admitting: Family Medicine

## 2019-02-22 ENCOUNTER — Other Ambulatory Visit: Payer: Self-pay | Admitting: Family Medicine

## 2019-02-22 MED ORDER — MELOXICAM 15 MG PO TABS: 15.0000 mg | ORAL_TABLET | Freq: Every day | ORAL | 1 refills | Status: DC

## 2019-02-24 ENCOUNTER — Telehealth: Payer: Self-pay | Admitting: Family Medicine

## 2019-02-24 NOTE — Telephone Encounter (Signed)
PA initiated via Covermymeds; [id number]. Awaiting determination.

## 2019-02-24 NOTE — Telephone Encounter (Signed)
Copied from [place] [id number]. Topic: General - Other >> Feb 24, 2019 11:07 AM Keene Breath wrote: Reason for CRM: [facility] called to get authorization for patient's medication, meloxicam (MOBIC) 15 MG tablet.  Please call to clarify.  CB# [phone]

## 2019-02-25 NOTE — Telephone Encounter (Signed)
PA cancelled.   This product does not require authorization at this time, and may be covered at the pharmacy in accordance with your benefit plan

## 2019-03-01 ENCOUNTER — Encounter: Payer: Self-pay | Admitting: Family Medicine

## 2019-03-01 DIAGNOSIS — I10 Essential (primary) hypertension: Secondary | ICD-10-CM

## 2019-03-01 DIAGNOSIS — E785 Hyperlipidemia, unspecified: Secondary | ICD-10-CM

## 2019-03-02 MED ORDER — SIMVASTATIN 40 MG PO TABS: 40.0000 mg | ORAL_TABLET | Freq: Every day | ORAL | 0 refills | Status: DC

## 2019-03-02 MED ORDER — NIACIN ER 1000 MG PO TBCR: 1.0000 | EXTENDED_RELEASE_TABLET | Freq: Every day | ORAL | 2 refills | Status: DC

## 2019-03-31 ENCOUNTER — Other Ambulatory Visit: Payer: Self-pay | Admitting: *Deleted

## 2019-03-31 DIAGNOSIS — E785 Hyperlipidemia, unspecified: Secondary | ICD-10-CM

## 2019-03-31 DIAGNOSIS — I10 Essential (primary) hypertension: Secondary | ICD-10-CM

## 2019-03-31 IMAGING — DX DG FOOT COMPLETE 3+V*R*
3 series · 3 of 3 positions shown · non-contrast
Comparison: None.

CLINICAL DATA: RT foot dorsal ganglion of mid foot at base of 2nd
MT npticed about 3-4 months ago but has grown. Hyperechoic area on
Ultrasound assess for foreign body. No old or recent injury

EXAM:
RIGHT FOOT COMPLETE - 3+ VIEW

[foot ap]
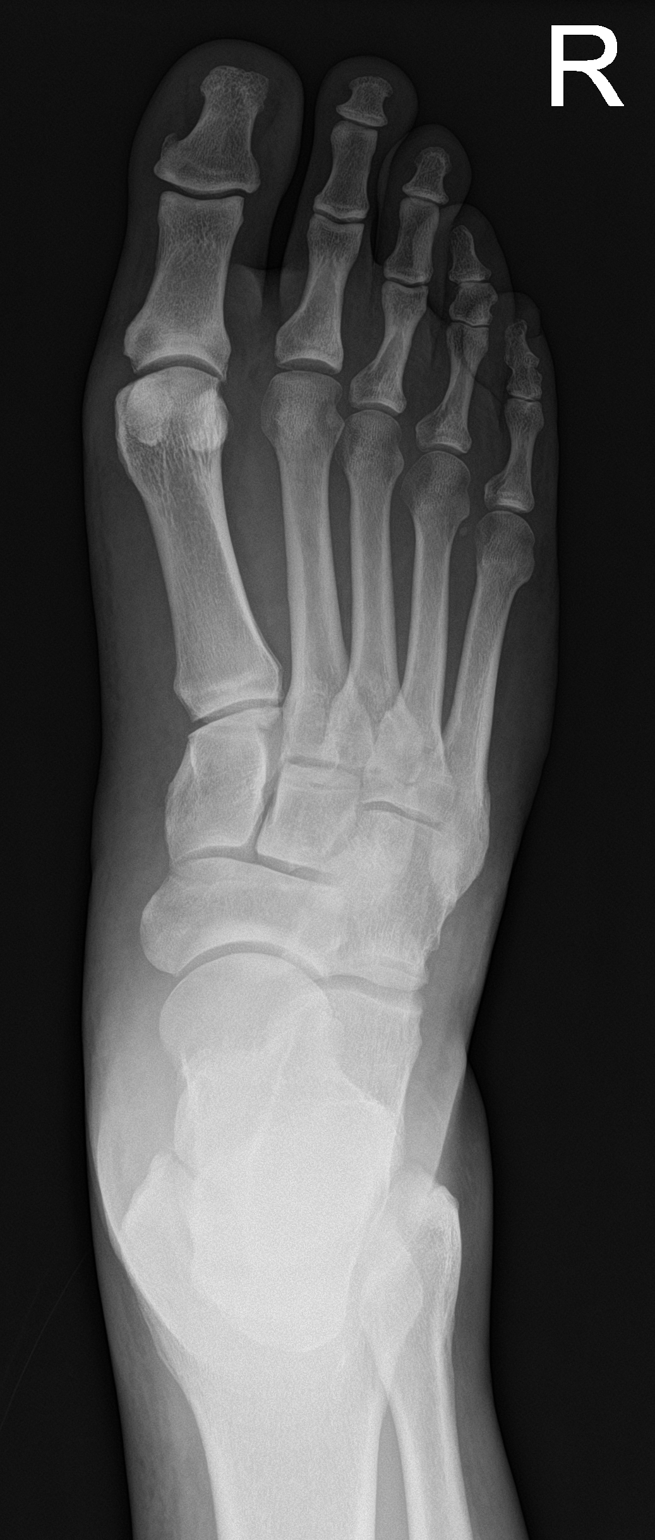

[foot obl]
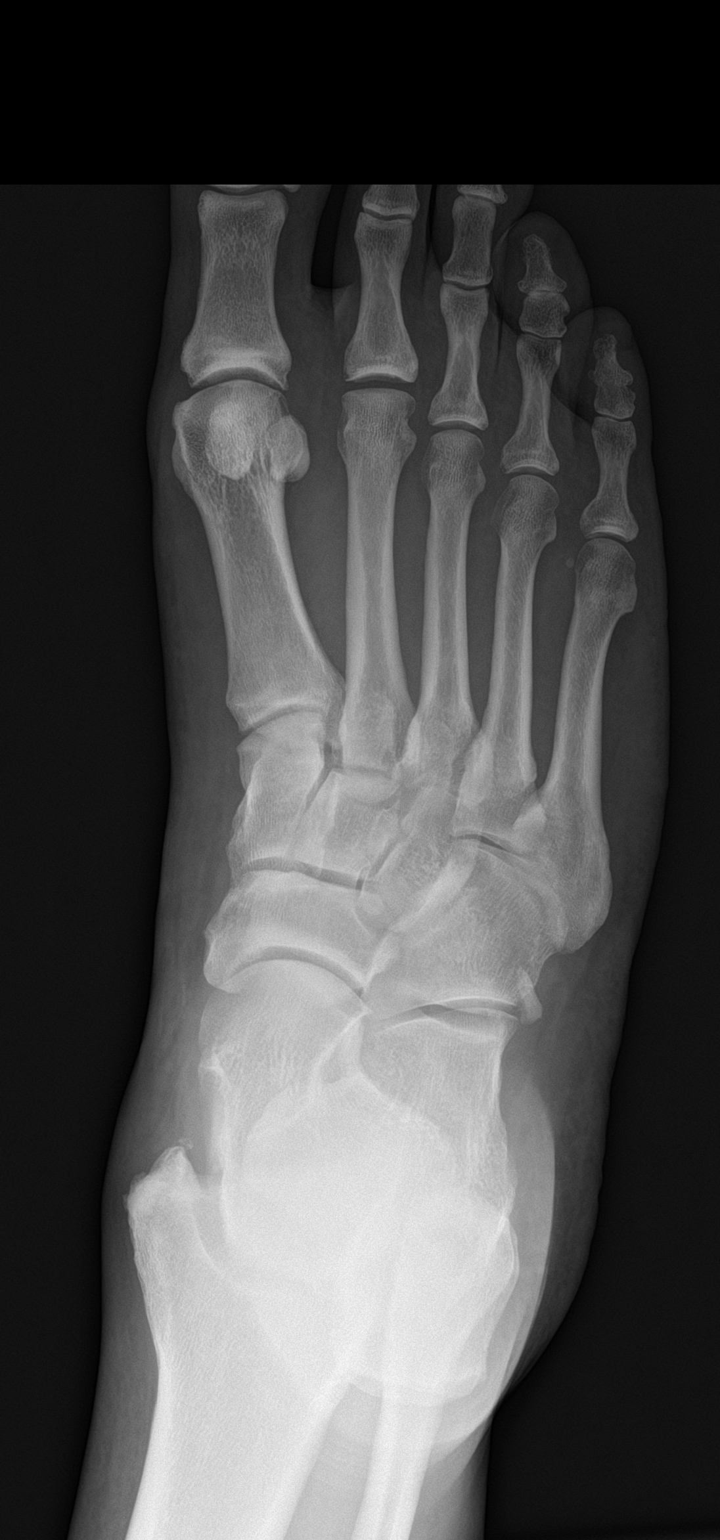

[foot lat]
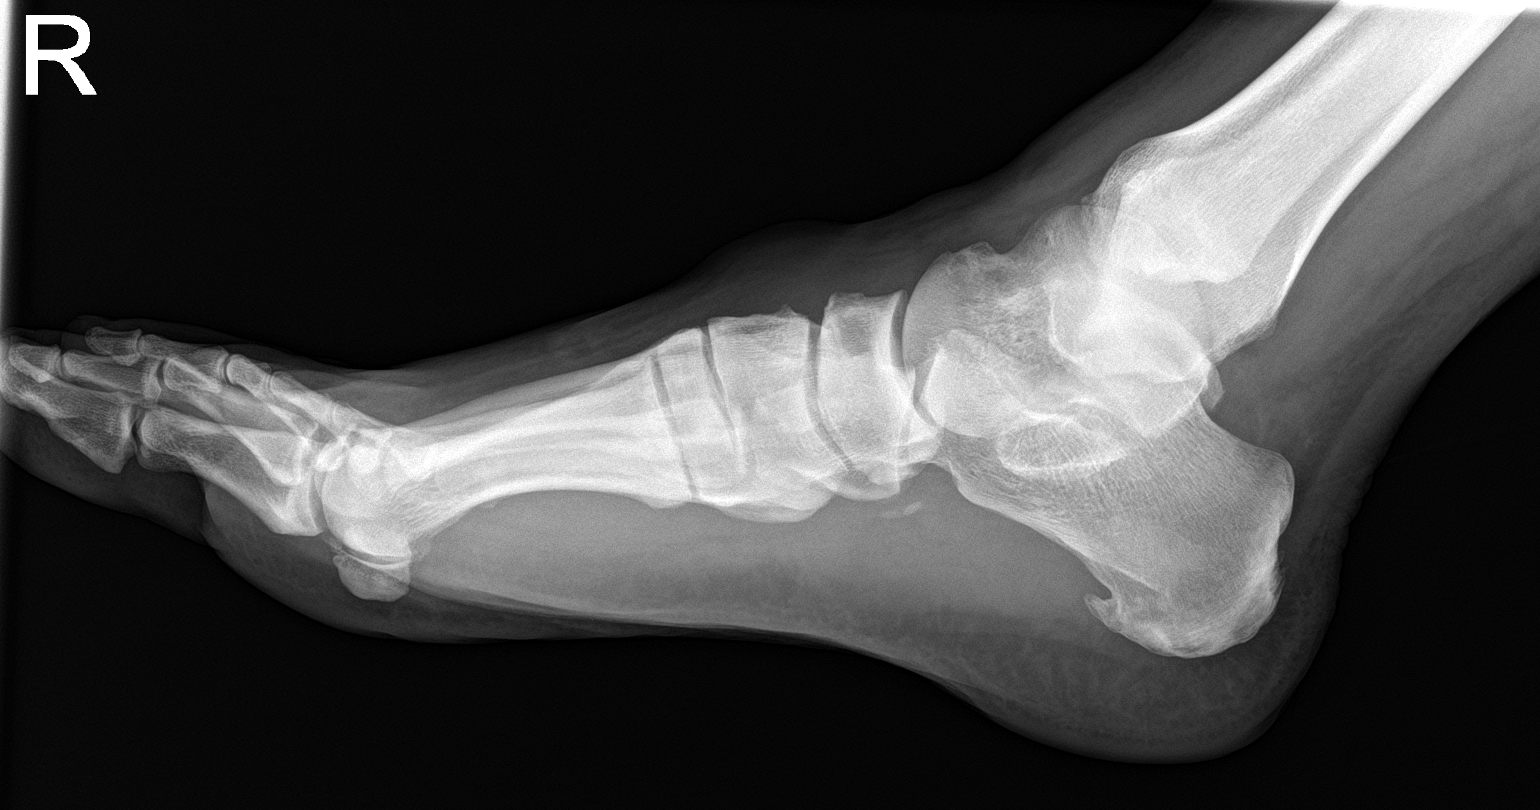

[3 of 3 positions shown; findings below may reference images not displayed]

FINDINGS: No fracture or dislocation of mid foot or forefoot. The phalanges
are normal. The calcaneus is normal. Enthesopathic spurring along
the plantar aspect of the calcaneus. No soft tissue abnormality.
IMPRESSION: No acute osseous abnormality.

## 2019-03-31 MED ORDER — METOPROLOL SUCCINATE ER 100 MG PO TB24: 100.0000 mg | ORAL_TABLET | Freq: Every day | ORAL | 0 refills | Status: DC

## 2019-04-01 ENCOUNTER — Telehealth: Payer: Self-pay | Admitting: Family Medicine

## 2019-04-01 NOTE — Telephone Encounter (Signed)
Left message on machine that if they were speaking about refills then he only gets 1 refill because patient has to follow up first to get more that 1 refill.

## 2019-04-01 NOTE — Telephone Encounter (Signed)
Terry called from  [place], [place] [address]  Phone: [phone] Fax: [phone]   Medication metoprolol succinate (TOPROL-XL) 100 MG 24 hr tablet [id number]        Wanted to increase patient medication from 1 to 3   Please advise

## 2019-04-18 ENCOUNTER — Encounter: Payer: Self-pay | Admitting: Pulmonary Disease

## 2019-04-18 ENCOUNTER — Other Ambulatory Visit: Payer: Self-pay

## 2019-04-18 ENCOUNTER — Ambulatory Visit: Payer: BC Managed Care – PPO | Admitting: Pulmonary Disease

## 2019-04-18 DIAGNOSIS — G4733 Obstructive sleep apnea (adult) (pediatric): Secondary | ICD-10-CM

## 2019-04-18 NOTE — Assessment & Plan Note (Signed)
Has residual AHI of 10/hour so we will need to increase pressure This is likely related to weight gain Prescription for new auto CPAP 12 to 18 cm will be sent to DME.  We will keep EPR setting of 2 cm Download will be checked in 1 month and settings fine-tuned if needed  Please check out AirFit F30 full facemask when able  Weight loss encouraged, compliance with goal of at least 4-6 hrs every night is the expectation. Advised against medications with sedative side effects Cautioned against driving when sleepy - understanding that sleepiness will vary on a day to day basis

## 2019-04-18 NOTE — Patient Instructions (Signed)
Prescription for new auto CPAP 12 to 18 cm will be sent to DME. Download will be checked in 1 month and settings fine-tuned if needed  Please check out AirFit F30 full facemask when able

## 2019-04-18 NOTE — Addendum Note (Signed)
Addended by: Nena Polio on: 04/18/2019 10:35 AM   Modules accepted: Orders

## 2019-04-18 NOTE — Progress Notes (Signed)
   Subjective:    Patient ID: Raymond Howell, male    DOB: [birth year], 59 y.o.   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  He has the same machine for more than 6 years it is now giving him a sign that more life is exceeded. Denies any problems with mask or pressure. I notice a weight gain from 2019 to his current weight of 235-about 15 pounds. Download shows residual AHI of 10/hour, occasionally exceeding 20/hour suggesting a positional component, with average pressure 16 cm on auto CPAP 12 to 16 cm Compliance is excellent more than 6.5 hours every night.  Leak is minimal   Significant tests/ events reviewed  PSG2004 (205 lbs )- AHI was 12/ , did not need treatment.  04/2010 PSG (240 lbs ) >>AHI 22/h , lowest desatn 87%, PLMs disappeared with PAP. Central apneas emerged at 11 cm &persisted on BiPAP 13/9.  Review of Systems neg for any significant sore throat, dysphagia, itching, sneezing, nasal congestion or excess/ purulent secretions, fever, chills, sweats, unintended wt loss, pleuritic or exertional cp, hempoptysis, orthopnea pnd or change in chronic leg swelling. Also denies presyncope, palpitations, heartburn, abdominal pain, nausea, vomiting, diarrhea or change in bowel or urinary habits, dysuria,hematuria, rash, arthralgias, visual complaints, headache, numbness weakness or ataxia.     Objective:   Physical Exam   Gen. Pleasant, obese, in no distress ENT - no lesions, no post nasal drip Neck: No JVD, no thyromegaly, no carotid bruits Lungs: no use of accessory muscles, no dullness to percussion, decreased without rales or rhonchi  Cardiovascular: Rhythm regular, heart sounds  normal, no murmurs or gallops, no peripheral edema Musculoskeletal: No deformities, no cyanosis or clubbing , no tremors        Assessment & Plan:

## 2019-05-02 DIAGNOSIS — G4733 Obstructive sleep apnea (adult) (pediatric): Secondary | ICD-10-CM | POA: Diagnosis not present

## 2019-05-18 ENCOUNTER — Other Ambulatory Visit: Payer: Self-pay

## 2019-05-19 ENCOUNTER — Other Ambulatory Visit: Payer: Self-pay

## 2019-05-19 ENCOUNTER — Ambulatory Visit (INDEPENDENT_AMBULATORY_CARE_PROVIDER_SITE_OTHER): Payer: BC Managed Care – PPO | Admitting: Family Medicine

## 2019-05-19 ENCOUNTER — Encounter: Payer: Self-pay | Admitting: Family Medicine

## 2019-05-19 VITALS — BP 128/80 | HR 49 | Temp 97.1°F | Resp 18 | Ht 70.0 in | Wt 235.8 lb

## 2019-05-19 DIAGNOSIS — Z0000 Encounter for general adult medical examination without abnormal findings: Secondary | ICD-10-CM | POA: Diagnosis not present

## 2019-05-19 DIAGNOSIS — E785 Hyperlipidemia, unspecified: Secondary | ICD-10-CM | POA: Diagnosis not present

## 2019-05-19 DIAGNOSIS — I10 Essential (primary) hypertension: Secondary | ICD-10-CM

## 2019-05-19 LAB — LIPID PANEL
Cholesterol: 112 mg/dL (ref 0–200)
HDL: 46.1 mg/dL (ref >39.00)
LDL Cholesterol: 46 mg/dL (ref 0–99)
NonHDL: 66.27
Total CHOL/HDL Ratio: 2
Triglycerides: 99 mg/dL (ref 0.0–149.0)
VLDL: 19.8 mg/dL (ref 0.0–40.0)

## 2019-05-19 LAB — COMPREHENSIVE METABOLIC PANEL
ALT: 32 U/L (ref 0–53)
AST: 30 U/L (ref 0–37)
Albumin: 4.6 g/dL (ref 3.5–5.2)
Alkaline Phosphatase: 56 U/L (ref 39–117)
BUN: 14 mg/dL (ref 6–23)
CO2: 28 mEq/L (ref 19–32)
Calcium: 9.7 mg/dL (ref 8.4–10.5)
Chloride: 104 mEq/L (ref 96–112)
Creatinine, Ser: 0.8 mg/dL (ref 0.40–1.50)
GFR: 98.75 mL/min (ref >60.00)
Glucose, Bld: 104 mg/dL — ABNORMAL HIGH (ref 70–99)
Potassium: 4.2 mEq/L (ref 3.5–5.1)
Sodium: 140 mEq/L (ref 135–145)
Total Bilirubin: 1.2 mg/dL (ref 0.2–1.2)
Total Protein: 6.9 g/dL (ref 6.0–8.3)

## 2019-05-19 LAB — CBC WITH DIFFERENTIAL/PLATELET
Basophils Absolute: 0 10*3/uL (ref 0.0–0.1)
Basophils Relative: 0.6 % (ref 0.0–3.0)
Eosinophils Absolute: 0.2 10*3/uL (ref 0.0–0.7)
Eosinophils Relative: 2.4 % (ref 0.0–5.0)
HCT: 44.9 % (ref 39.0–52.0)
Hemoglobin: 15.5 g/dL (ref 13.0–17.0)
Lymphocytes Relative: 23.8 % (ref 12.0–46.0)
Lymphs Abs: 1.8 10*3/uL (ref 0.7–4.0)
MCHC: 34.6 g/dL (ref 30.0–36.0)
MCV: 94.7 fl (ref 78.0–100.0)
Monocytes Absolute: 0.6 10*3/uL (ref 0.1–1.0)
Monocytes Relative: 8.4 % (ref 3.0–12.0)
Neutro Abs: 4.8 10*3/uL (ref 1.4–7.7)
Neutrophils Relative %: 64.8 % (ref 43.0–77.0)
Platelets: 177 10*3/uL (ref 150.0–400.0)
RBC: 4.73 Mil/uL (ref 4.22–5.81)
RDW: 12.6 % (ref 11.5–15.5)
WBC: 7.4 10*3/uL (ref 4.0–10.5)

## 2019-05-19 LAB — TSH: TSH: 1.93 u[IU]/mL (ref 0.35–4.50)

## 2019-05-19 MED ORDER — SIMVASTATIN 40 MG PO TABS: 40.0000 mg | ORAL_TABLET | Freq: Every day | ORAL | 3 refills | Status: DC

## 2019-05-19 MED ORDER — NIACIN ER 1000 MG PO TBCR: 1.0000 | EXTENDED_RELEASE_TABLET | Freq: Every day | ORAL | 3 refills | Status: DC

## 2019-05-19 MED ORDER — METOPROLOL SUCCINATE ER 100 MG PO TB24: 100.0000 mg | ORAL_TABLET | Freq: Every day | ORAL | 3 refills | Status: DC

## 2019-05-19 NOTE — Progress Notes (Signed)
Patient ID: Raymond Howell, male    DOB: 1961-02-01  Age: 59 y.o. MRN: [id number]    Subjective:  Subjective  HPI Raymond Howell presents for cpe .   No complaints   Review of Systems  Constitutional: Negative for appetite change, diaphoresis, fatigue and unexpected weight change.  Eyes: Negative for pain, redness and visual disturbance.  Respiratory: Negative for cough, chest tightness, shortness of breath and wheezing.   Cardiovascular: Negative for chest pain, palpitations and leg swelling.  Endocrine: Negative for cold intolerance, heat intolerance, polydipsia, polyphagia and polyuria.  Genitourinary: Negative for difficulty urinating, dysuria and frequency.  Neurological: Negative for dizziness, light-headedness, numbness and headaches.    History Past Medical History:  Diagnosis Date  . Angiodysplasia of colon   . Basal cell carcinoma of nose   . Chest pain   . Family history of heart disease   . Hyperlipidemia   . Hypertension   . Sleep apnea    CPAP  . Umbilical hernia   . Venereal wart     Raymond Howell has a past surgical history that includes Hernia repair (2004); Mohs surgery; Mouth surgery; Appendectomy (1987); Colonoscopy; and Tooth extraction.   His family history includes Cancer in an other family member; Coronary artery disease in an other family member; Heart attack (age of onset: 28) in his maternal grandfather; Heart disease (age of onset: 38) in his father.Raymond Howell reports that Raymond Howell has never smoked. Raymond Howell has never used smokeless tobacco. Raymond Howell reports current alcohol use of about 3.0 - 4.0 standard drinks of alcohol per week. Raymond Howell reports that Raymond Howell does not use drugs.  Current Outpatient Medications on File Prior to Visit  Medication Sig Dispense Refill  . aspirin 81 MG tablet Take 81 mg by mouth daily.      . Flaxseed, Linseed, (FLAXSEED OIL) 1000 MG CAPS Take by mouth 2 (two) times daily.      . meloxicam (MOBIC) 15 MG tablet Take 1 tablet (15 mg total) by  mouth daily. (Patient taking differently: Take 15 mg by mouth as needed. ) 90 tablet 1  . Multiple Vitamin (MULTIVITAMIN) tablet Take 1 tablet by mouth daily.      . NON FORMULARY at bedtime. CPAP     No current facility-administered medications on file prior to visit.     Objective:  Objective  Physical Exam Vitals and nursing note reviewed.  Constitutional:      General: Raymond Howell is not in acute distress.    Appearance: Raymond Howell is well-developed. Raymond Howell is not diaphoretic.  HENT:     Head: Normocephalic and atraumatic.     Right Ear: External ear normal.     Left Ear: External ear normal.     Nose: Nose normal.     Mouth/Throat:     Pharynx: No oropharyngeal exudate.  Eyes:     General:        Right eye: No discharge.        Left eye: No discharge.     Conjunctiva/sclera: Conjunctivae normal.     Pupils: Pupils are equal, round, and reactive to light.  Neck:     Thyroid: No thyromegaly.     Vascular: No JVD.  Cardiovascular:     Rate and Rhythm: Normal rate and regular rhythm.     Heart sounds: No murmur. No friction rub. No gallop.   Pulmonary:     Effort: Pulmonary effort is normal. No respiratory distress.  Breath sounds: Normal breath sounds. No wheezing or rales.  Chest:     Chest wall: No tenderness.  Abdominal:     General: Bowel sounds are normal. There is no distension.     Palpations: Abdomen is soft. There is no mass.     Tenderness: There is no abdominal tenderness. There is no guarding or rebound.  Genitourinary:    Comments: Per urology Musculoskeletal:        General: No tenderness. Normal range of motion.     Cervical back: Normal range of motion and neck supple.  Lymphadenopathy:     Cervical: No cervical adenopathy.  Skin:    General: Skin is warm and dry.     Coloration: Skin is not pale.     Findings: No erythema or rash.  Neurological:     Mental Status: Raymond Howell is alert and oriented to person, place, and time.     Motor: No abnormal muscle tone.     Deep  Tendon Reflexes: Reflexes are normal and symmetric. Reflexes normal.  Psychiatric:        Behavior: Behavior normal.        Thought Content: Thought content normal.        Judgment: Judgment normal.    BP 128/80 (BP Location: Left Arm, Patient Position: Sitting, Cuff Size: Normal)   Pulse (!) 49   Temp (!) 97.1 F (36.2 C) (Temporal)   Resp 18   Ht 5\' 10"  (1.778 m)   Wt 235 lb 12.8 oz (107 kg)   SpO2 99%   BMI 33.83 kg/m  Wt Readings from Last 3 Encounters:  05/19/19 235 lb 12.8 oz (107 kg)  04/18/19 235 lb 3.2 oz (106.7 kg)  04/20/18 231 lb 6.4 oz (105 kg)     Lab Results  Component Value Date   WBC 7.9 04/20/2018   HGB 16.0 04/20/2018   HCT 45.3 04/20/2018   PLT 202.0 04/20/2018   GLUCOSE 91 04/20/2018   CHOL 128 04/20/2018   TRIG 168.0 (H) 04/20/2018   HDL 46.50 04/20/2018   LDLCALC 48 04/20/2018   ALT 28 04/20/2018   AST 32 04/20/2018   NA 140 04/20/2018   K 4.2 04/20/2018   CL 102 04/20/2018   CREATININE 0.82 04/20/2018   BUN 19 04/20/2018   CO2 31 04/20/2018   TSH 1.61 04/20/2018   PSA 1.5 03/18/2016   HGBA1C 4.9 12/03/2017   MICROALBUR <0.7 03/18/2016    DG Foot Complete Right  Result Date: 02/24/2017 CLINICAL DATA:  RT foot dorsal ganglion of mid foot at base of 2nd MT npticed about 3-4 months ago but has grown. Hyperechoic area on Ultrasound assess for foreign body. No old or recent injury EXAM: RIGHT FOOT COMPLETE - 3+ VIEW COMPARISON:  None. FINDINGS: No fracture or dislocation of mid foot or forefoot. The phalanges are normal. The calcaneus is normal. Enthesopathic spurring along the plantar aspect of the calcaneus. No soft tissue abnormality. IMPRESSION: No acute osseous abnormality. Electronically Signed   By: Suzy Bouchard M.D.   On: 02/24/2017 16:16     Assessment & Plan:  Plan  I am having Raymond Howell [employer]" maintain his aspirin, Flaxseed Oil, multivitamin, NON FORMULARY, meloxicam, metoprolol succinate, simvastatin, and Niacin CR.   Meds ordered this encounter  Medications  . metoprolol succinate (TOPROL-XL) 100 MG 24 hr tablet    Sig: Take 1 tablet (100 mg total) by mouth daily.    Dispense:  90 tablet    Refill:  3  . simvastatin (ZOCOR) 40 MG tablet    Sig: Take 1 tablet (40 mg total) by mouth at bedtime.    Dispense:  90 tablet    Refill:  3  . Niacin CR 1000 MG TBCR    Sig: Take 1 tablet (1,000 mg total) by mouth daily.    Dispense:  90 tablet    Refill:  3    Problem List Items Addressed This Visit      Unprioritized   Essential hypertension    Well controlled, no changes to meds. Encouraged heart healthy diet such as the DASH diet and exercise as tolerated.       Relevant Medications   metoprolol succinate (TOPROL-XL) 100 MG 24 hr tablet   simvastatin (ZOCOR) 40 MG tablet   Niacin CR 1000 MG TBCR   Other Relevant Orders   Lipid panel   CBC with Differential   Comprehensive metabolic panel   Hyperlipidemia    Tolerating statin, encouraged heart healthy diet, avoid trans fats, minimize simple carbs and saturated fats. Increase exercise as tolerated      Relevant Medications   metoprolol succinate (TOPROL-XL) 100 MG 24 hr tablet   simvastatin (ZOCOR) 40 MG tablet   Niacin CR 1000 MG TBCR   Other Relevant Orders   Lipid panel   Comprehensive metabolic panel   Preventative health care - Primary    ghm utd Check labs See AVS      Relevant Orders   TSH   Lipid panel   CBC with Differential   Comprehensive metabolic panel      Follow-up: Return in about 6 months (around 11/16/2019), or if symptoms worsen or fail to improve, for hypertension, hyperlipidemia.  [name], DO

## 2019-05-19 NOTE — Patient Instructions (Signed)
Preventive Care 41-60 Years Old, Male Preventive care refers to lifestyle choices and visits with your health care provider that can promote health and wellness. This includes:  A yearly physical exam. This is also called an annual well check.  Regular dental and eye exams.  Immunizations.  Screening for certain conditions.  Healthy lifestyle choices, such as eating a healthy diet, getting regular exercise, not using drugs or products that contain nicotine and tobacco, and limiting alcohol use. What can I expect for my preventive care visit? Physical exam Your health care provider will check:  Height and weight. These may be used to calculate body mass index (BMI), which is a measurement that tells if you are at a healthy weight.  Heart rate and blood pressure.  Your skin for abnormal spots. Counseling Your health care provider may ask you questions about:  Alcohol, tobacco, and drug use.  Emotional well-being.  Home and relationship well-being.  Sexual activity.  Eating habits.  Work and work Statistician. What immunizations do I need?  Influenza (flu) vaccine  This is recommended every year. Tetanus, diphtheria, and pertussis (Tdap) vaccine  You may need a Td booster every 10 years. Varicella (chickenpox) vaccine  You may need this vaccine if you have not already been vaccinated. Zoster (shingles) vaccine  You may need this after age 64. Measles, mumps, and rubella (MMR) vaccine  You may need at least one dose of MMR if you were born in 1957 or later. You may also need a second dose. Pneumococcal conjugate (PCV13) vaccine  You may need this if you have certain conditions and were not previously vaccinated. Pneumococcal polysaccharide (PPSV23) vaccine  You may need one or two doses if you smoke cigarettes or if you have certain conditions. Meningococcal conjugate (MenACWY) vaccine  You may need this if you have certain conditions. Hepatitis A  vaccine  You may need this if you have certain conditions or if you travel or work in places where you may be exposed to hepatitis A. Hepatitis B vaccine  You may need this if you have certain conditions or if you travel or work in places where you may be exposed to hepatitis B. Haemophilus influenzae type b (Hib) vaccine  You may need this if you have certain risk factors. Human papillomavirus (HPV) vaccine  If recommended by your health care provider, you may need three doses over 6 months. You may receive vaccines as individual doses or as more than one vaccine together in one shot (combination vaccines). Talk with your health care provider about the risks and benefits of combination vaccines. What tests do I need? Blood tests  Lipid and cholesterol levels. These may be checked every 5 years, or more frequently if you are over 60 years old.  Hepatitis C test.  Hepatitis B test. Screening  Lung cancer screening. You may have this screening every year starting at age 43 if you have a 30-pack-year history of smoking and currently smoke or have quit within the past 15 years.  Prostate cancer screening. Recommendations will vary depending on your family history and other risks.  Colorectal cancer screening. All adults should have this screening starting at age 72 and continuing until age 2. Your health care provider may recommend screening at age 14 if you are at increased risk. You will have tests every 1-10 years, depending on your results and the type of screening test.  Diabetes screening. This is done by checking your blood sugar (glucose) after you have not eaten  for a while (fasting). You may have this done every 1-3 years.  Sexually transmitted disease (STD) testing. Follow these instructions at home: Eating and drinking  Eat a diet that includes fresh fruits and vegetables, whole grains, lean protein, and low-fat dairy products.  Take vitamin and mineral supplements as  recommended by your health care provider.  Do not drink alcohol if your health care provider tells you not to drink.  If you drink alcohol: ? Limit how much you have to 0-2 drinks a day. ? Be aware of how much alcohol is in your drink. In the [place] one drink equals one 12 oz bottle of beer (355 mL), one 5 oz glass of wine (148 mL), or one 1 oz glass of hard liquor (44 mL). Lifestyle  Take daily care of your teeth and gums.  Stay active. Exercise for at least 30 minutes on 5 or more days each week.  Do not use any products that contain nicotine or tobacco, such as cigarettes, e-cigarettes, and chewing tobacco. If you need help quitting, ask your health care provider.  If you are sexually active, practice safe sex. Use a condom or other form of protection to prevent STIs (sexually transmitted infections).  Talk with your health care provider about taking a low-dose aspirin every day starting at age 53. What's next?  Go to your health care provider once a year for a well check visit.  Ask your health care provider how often you should have your eyes and teeth checked.  Stay up to date on all vaccines. This information is not intended to replace advice given to you by your health care provider. Make sure you discuss any questions you have with your health care provider. Document Revised: 04/22/2018 Document Reviewed: 04/22/2018 Elsevier Patient Education  2020 [employer].

## 2019-05-19 NOTE — Assessment & Plan Note (Signed)
ghm utd Check labs See AVS 

## 2019-05-19 NOTE — Assessment & Plan Note (Signed)
Tolerating statin, encouraged heart healthy diet, avoid trans fats, minimize simple carbs and saturated fats. Increase exercise as tolerated 

## 2019-05-19 NOTE — Assessment & Plan Note (Signed)
Well controlled, no changes to meds. Encouraged heart healthy diet such as the DASH diet and exercise as tolerated.  °

## 2019-05-23 DIAGNOSIS — N400 Enlarged prostate without lower urinary tract symptoms: Secondary | ICD-10-CM | POA: Diagnosis not present

## 2019-05-23 DIAGNOSIS — Z85828 Personal history of other malignant neoplasm of skin: Secondary | ICD-10-CM | POA: Diagnosis not present

## 2019-05-23 DIAGNOSIS — D225 Melanocytic nevi of trunk: Secondary | ICD-10-CM | POA: Diagnosis not present

## 2019-05-23 DIAGNOSIS — L570 Actinic keratosis: Secondary | ICD-10-CM | POA: Diagnosis not present

## 2019-05-23 DIAGNOSIS — L814 Other melanin hyperpigmentation: Secondary | ICD-10-CM | POA: Diagnosis not present

## 2019-05-23 DIAGNOSIS — L821 Other seborrheic keratosis: Secondary | ICD-10-CM | POA: Diagnosis not present

## 2019-06-02 DIAGNOSIS — G4733 Obstructive sleep apnea (adult) (pediatric): Secondary | ICD-10-CM | POA: Diagnosis not present

## 2019-06-11 ENCOUNTER — Telehealth: Payer: Self-pay | Admitting: Pulmonary Disease

## 2019-06-11 DIAGNOSIS — G4733 Obstructive sleep apnea (adult) (pediatric): Secondary | ICD-10-CM

## 2019-06-11 NOTE — Telephone Encounter (Signed)
1 month download reviewed on new CPAP >> auto 12-18 cm avg pr 17 cm Residual AHI 10/h  Small leak  Not perfect but Ok for now Please increase auto settings to 14-18 cm Arrange FU in 3-4 months to review with download He can call with any symptoms

## 2019-06-13 DIAGNOSIS — Z1152 Encounter for screening for COVID-19: Secondary | ICD-10-CM | POA: Diagnosis not present

## 2019-06-13 NOTE — Telephone Encounter (Signed)
LVM to advise the settings on the machine will be sent to the DME and if there are any questions to call the office. Advised in message Dr. Elsworth Soho schedule has not been set up just yet, but he will be contacted once it is available.  Order placed. Nothing further needed at this time.

## 2019-07-03 DIAGNOSIS — G4733 Obstructive sleep apnea (adult) (pediatric): Secondary | ICD-10-CM | POA: Diagnosis not present

## 2019-07-29 ENCOUNTER — Ambulatory Visit: Payer: BC Managed Care – PPO | Attending: Internal Medicine

## 2019-07-29 DIAGNOSIS — Z23 Encounter for immunization: Secondary | ICD-10-CM

## 2019-07-29 NOTE — Progress Notes (Signed)
   Covid-19 Vaccination Clinic  Name:  Raymond Howell    MRN: [id number] DOB: [birth year]  07/29/2019  [name] was observed post Covid-19 immunization for 15 minutes without incident. He was provided with Vaccine Information Sheet and instruction to access the V-Safe system.   [name] was instructed to call 911 with any severe reactions post vaccine: Marland Kitchen Difficulty breathing  . Swelling of face and throat  . A fast heartbeat  . A bad rash all over body  . Dizziness and weakness   Immunizations Administered    Name Date Dose VIS Date Route   Pfizer COVID-19 Vaccine 07/29/2019  1:32 PM 0.3 mL 04/22/2019 Intramuscular   Manufacturer: [place]   Lot: [id number]   [place]: [id number]

## 2019-07-31 DIAGNOSIS — G4733 Obstructive sleep apnea (adult) (pediatric): Secondary | ICD-10-CM | POA: Diagnosis not present

## 2019-08-23 ENCOUNTER — Ambulatory Visit: Payer: BC Managed Care – PPO | Attending: Internal Medicine

## 2019-08-23 DIAGNOSIS — Z23 Encounter for immunization: Secondary | ICD-10-CM

## 2019-08-23 NOTE — Progress Notes (Signed)
   Covid-19 Vaccination Clinic  Name:  Raymond Howell    MRN: [id number] DOB: [birth year]  08/23/2019  [name] was observed post Covid-19 immunization for 15 minutes without incident. He was provided with Vaccine Information Sheet and instruction to access the V-Safe system.   [name] was instructed to call 911 with any severe reactions post vaccine: Marland Kitchen Difficulty breathing  . Swelling of face and throat  . A fast heartbeat  . A bad rash all over body  . Dizziness and weakness   Immunizations Administered    Name Date Dose VIS Date Route   Pfizer COVID-19 Vaccine 08/23/2019  2:28 PM 0.3 mL 04/22/2019 Intramuscular   Manufacturer: [place]   Lot: [id number]   [place]: [id number]

## 2019-08-31 DIAGNOSIS — G4733 Obstructive sleep apnea (adult) (pediatric): Secondary | ICD-10-CM | POA: Diagnosis not present

## 2019-09-04 DIAGNOSIS — Z20828 Contact with and (suspected) exposure to other viral communicable diseases: Secondary | ICD-10-CM | POA: Diagnosis not present

## 2019-09-30 DIAGNOSIS — G4733 Obstructive sleep apnea (adult) (pediatric): Secondary | ICD-10-CM | POA: Diagnosis not present

## 2019-11-07 NOTE — Telephone Encounter (Signed)
Dr. Elsworth Soho please advise on patients mychart message  My machine is approx 13 months old. I got the recall notice and registered my machine on the [employer]. I have been using a SoClean system (Ozone) and have disconnected the SoClean system from the CPAP machine. Should I continue to use my machine or stop using it. My wife says I have been snoring more lately so I would ask for you to review my data for the last 30 days and see if any further adjustments need to be made. Please let me what  should do  from here. Thanks.   Report has been printed and given to Dr. Elsworth Soho to review

## 2019-11-07 NOTE — Telephone Encounter (Signed)
Reviewed downloads, residual AHI is about 7/hour which is much improved from prior, average CPAP pressure is 17.6 cm. Leak is minimal So current settings are adequate  Regarding recall , current guidance is -To stop using ozone cleaning system -I think for him, the risk of stopping therapy is much higher than that of continuing.  Understanding that it may take 2 to 3 months for them to replace your machine -If possible, obtain bacterial filter from DME to add to hose  We will keep him updated as soon as we know more but he is doing the right thing by monitoring the [employer] for more information

## 2020-03-15 ENCOUNTER — Other Ambulatory Visit: Payer: Self-pay

## 2020-03-15 ENCOUNTER — Ambulatory Visit (INDEPENDENT_AMBULATORY_CARE_PROVIDER_SITE_OTHER): Payer: BC Managed Care – PPO

## 2020-03-15 DIAGNOSIS — Z23 Encounter for immunization: Secondary | ICD-10-CM | POA: Diagnosis not present

## 2020-05-15 ENCOUNTER — Ambulatory Visit: Payer: BC Managed Care – PPO | Admitting: Pulmonary Disease

## 2020-05-21 ENCOUNTER — Other Ambulatory Visit: Payer: Self-pay

## 2020-05-21 ENCOUNTER — Ambulatory Visit (INDEPENDENT_AMBULATORY_CARE_PROVIDER_SITE_OTHER): Payer: BC Managed Care – PPO | Admitting: Family Medicine

## 2020-05-21 VITALS — BP 142/90 | HR 49 | Temp 98.1°F | Ht 70.0 in | Wt 246.0 lb

## 2020-05-21 DIAGNOSIS — Z01810 Encounter for preprocedural cardiovascular examination: Secondary | ICD-10-CM

## 2020-05-21 DIAGNOSIS — E785 Hyperlipidemia, unspecified: Secondary | ICD-10-CM

## 2020-05-21 DIAGNOSIS — I10 Essential (primary) hypertension: Secondary | ICD-10-CM | POA: Diagnosis not present

## 2020-05-21 DIAGNOSIS — Z0000 Encounter for general adult medical examination without abnormal findings: Secondary | ICD-10-CM | POA: Diagnosis not present

## 2020-05-21 MED ORDER — NIACIN ER 1000 MG PO TBCR: 1.0000 | EXTENDED_RELEASE_TABLET | Freq: Every day | ORAL | 3 refills | Status: DC

## 2020-05-21 MED ORDER — SIMVASTATIN 40 MG PO TABS: 40.0000 mg | ORAL_TABLET | Freq: Every day | ORAL | 3 refills | Status: DC

## 2020-05-21 MED ORDER — METOPROLOL SUCCINATE ER 100 MG PO TB24: 100.0000 mg | ORAL_TABLET | Freq: Every day | ORAL | 3 refills | Status: DC

## 2020-05-21 NOTE — Progress Notes (Signed)
Patient ID: Raymond Howell, male    DOB: [birth year]  Age: 60 y.o. MRN: [id number]    Subjective:  Subjective  HPI   Raymond HASEGAWA presents for cpe    No compliants  Review of Systems  Constitutional: Negative for appetite change, diaphoresis, fatigue and unexpected weight change.  Eyes: Negative for pain, redness and visual disturbance.  Respiratory: Negative for cough, chest tightness, shortness of breath and wheezing.   Cardiovascular: Negative for chest pain, palpitations and leg swelling.  Endocrine: Negative for cold intolerance, heat intolerance, polydipsia, polyphagia and polyuria.  Genitourinary: Negative for difficulty urinating, dysuria and frequency.  Neurological: Negative for dizziness, light-headedness, numbness and headaches.    History Past Medical History:  Diagnosis Date  . Angiodysplasia of colon   . Basal cell carcinoma of nose   . Chest pain   . Family history of heart disease   . Hyperlipidemia   . Hypertension   . Sleep apnea    CPAP  . Umbilical hernia   . Venereal wart     He has a past surgical history that includes Hernia repair (2004); Mohs surgery; Mouth surgery; Appendectomy (1987); Colonoscopy; and Tooth extraction.   His family history includes Cancer in an other family member; Coronary artery disease in an other family member; Heart attack (age of onset: 27) in his maternal grandfather; Heart disease (age of onset: 66) in his father.He reports that he has never smoked. He has never used smokeless tobacco. He reports current alcohol use of about 3.0 - 4.0 standard drinks of alcohol per week. He reports that he does not use drugs.  Current Outpatient Medications on File Prior to Visit  Medication Sig Dispense Refill  . aspirin 81 MG tablet Take 81 mg by mouth daily.    . Flaxseed, Linseed, (FLAXSEED OIL) 1000 MG CAPS Take by mouth 2 (two) times daily.    . meloxicam (MOBIC) 15 MG tablet Take 1 tablet (15 mg total) by mouth daily. (Patient taking  differently: Take 15 mg by mouth as needed.) 90 tablet 1  . Multiple Vitamin (MULTIVITAMIN) tablet Take 1 tablet by mouth daily.    . NON FORMULARY at bedtime. CPAP     No current facility-administered medications on file prior to visit.      Health Maintenance  Topic Date Due  . COVID-19 Vaccine (3 - Pfizer risk 4-dose series) 06/06/2020 (Originally 09/20/2019)  . COLONOSCOPY (Pts 45-72yrs Insurance coverage will need to be confirmed)  07/13/2023  . TETANUS/TDAP  09/14/2024  . INFLUENZA VACCINE  Completed  . Hepatitis C Screening  Completed  . HIV Screening  Completed   Objective:  Objective  Physical Exam Vitals and nursing note reviewed.  Constitutional:      General: He is not in acute distress.    Appearance: He is well-developed and well-nourished. He is not diaphoretic.  HENT:     Head: Normocephalic and atraumatic.     Right Ear: External ear normal.     Left Ear: External ear normal.     Nose: Nose normal.     Mouth/Throat:     Mouth: Oropharynx is clear and moist.     Pharynx: No oropharyngeal exudate.  Eyes:     General:        Right eye: No discharge.        Left eye: No discharge.     Extraocular Movements: EOM normal.     Conjunctiva/sclera: Conjunctivae normal.     Pupils: Pupils are equal,  round, and reactive to light.  Neck:     Thyroid: No thyromegaly.     Vascular: No JVD.  Cardiovascular:     Rate and Rhythm: Normal rate and regular rhythm.     Pulses: Intact distal pulses.     Heart sounds: No murmur heard. No friction rub. No gallop.   Pulmonary:     Effort: Pulmonary effort is normal. No respiratory distress.     Breath sounds: Normal breath sounds. No wheezing or rales.  Chest:     Chest wall: No tenderness.  Abdominal:     General: Bowel sounds are normal. There is no distension.     Palpations: Abdomen is soft. There is no mass.     Tenderness: There is no abdominal tenderness. There is no guarding or rebound.  Genitourinary:     Comments: Per urology Musculoskeletal:        General: No tenderness or edema. Normal range of motion.     Cervical back: Normal range of motion and neck supple.  Lymphadenopathy:     Cervical: No cervical adenopathy.  Skin:    General: Skin is warm and dry.     Coloration: Skin is not pale.     Findings: No erythema or rash.  Neurological:     Mental Status: He is alert and oriented to person, place, and time.     Motor: No abnormal muscle tone.     Deep Tendon Reflexes: Reflexes are normal and symmetric. Reflexes normal.  Psychiatric:        Mood and Affect: Mood and affect normal.        Behavior: Behavior normal.        Thought Content: Thought content normal.        Judgment: Judgment normal.    BP (!) 142/90 (BP Location: Left Arm, Patient Position: Sitting, Cuff Size: Large)   Pulse (!) 49   Temp 98.1 F (36.7 C) (Oral)   Ht 5\' 10"  (1.778 m)   Wt 246 lb (111.6 kg)   SpO2 99%   BMI 35.30 kg/m  Wt Readings from Last 3 Encounters:  05/21/20 246 lb (111.6 kg)  05/19/19 235 lb 12.8 oz (107 kg)  04/18/19 235 lb 3.2 oz (106.7 kg)     Lab Results  Component Value Date   WBC 7.4 05/21/2020   HGB 15.2 05/21/2020   HCT 43.5 05/21/2020   PLT 184.0 05/21/2020   GLUCOSE 98 05/21/2020   CHOL 123 05/21/2020   TRIG 172.0 (H) 05/21/2020   HDL 47.10 05/21/2020   LDLCALC 42 05/21/2020   ALT 37 05/21/2020   AST 35 05/21/2020   NA 139 05/21/2020   K 4.4 05/21/2020   CL 104 05/21/2020   CREATININE 0.82 05/21/2020   BUN 13 05/21/2020   CO2 30 05/21/2020   TSH 2.10 05/21/2020   PSA 1.5 03/18/2016   HGBA1C 4.9 12/03/2017   MICROALBUR <0.7 05/21/2020    DG Foot Complete Right  Result Date: 02/24/2017 CLINICAL DATA:  RT foot dorsal ganglion of mid foot at base of 2nd MT npticed about 3-4 months ago but has grown. Hyperechoic area on Ultrasound assess for foreign body. No old or recent injury EXAM: RIGHT FOOT COMPLETE - 3+ VIEW COMPARISON:  None. FINDINGS: No fracture or  dislocation of mid foot or forefoot. The phalanges are normal. The calcaneus is normal. Enthesopathic spurring along the plantar aspect of the calcaneus. No soft tissue abnormality. IMPRESSION: No acute osseous abnormality. Electronically Signed  By: Suzy Bouchard M.D.   On: 02/24/2017 16:16     Assessment & Plan:  Plan  I am having Raymond Howell [employer]" maintain his aspirin, Flaxseed Oil, multivitamin, NON FORMULARY, meloxicam, simvastatin, Niacin CR, and metoprolol succinate.  Meds ordered this encounter  Medications  . simvastatin (ZOCOR) 40 MG tablet    Sig: Take 1 tablet (40 mg total) by mouth at bedtime.    Dispense:  90 tablet    Refill:  3  . Niacin CR 1000 MG TBCR    Sig: Take 1 tablet (1,000 mg total) by mouth daily.    Dispense:  90 tablet    Refill:  3  . metoprolol succinate (TOPROL-XL) 100 MG 24 hr tablet    Sig: Take 1 tablet (100 mg total) by mouth daily.    Dispense:  90 tablet    Refill:  3    Problem List Items Addressed This Visit      Unprioritized   Essential hypertension    Well controlled, no changes to meds. Encouraged heart healthy diet such as the DASH diet and exercise as tolerated.       Relevant Medications   simvastatin (ZOCOR) 40 MG tablet   Niacin CR 1000 MG TBCR   metoprolol succinate (TOPROL-XL) 100 MG 24 hr tablet   Other Relevant Orders   Lipid panel (Completed)   CBC with Differential/Platelet (Completed)   TSH (Completed)   Comprehensive metabolic panel (Completed)   Microalbumin / creatinine urine ratio (Completed)   EKG 12-Lead (Completed)   Hyperlipidemia    Tolerating statin, encouraged heart healthy diet, avoid trans fats, minimize simple carbs and saturated fats. Increase exercise as tolerated      Relevant Medications   simvastatin (ZOCOR) 40 MG tablet   Niacin CR 1000 MG TBCR   metoprolol succinate (TOPROL-XL) 100 MG 24 hr tablet   Other Relevant Orders   Lipid panel (Completed)   Comprehensive metabolic panel  (Completed)   Preventative health care - Primary    ghm utd Pt had 2/3 covid vaccines  See avs       Relevant Orders   Lipid panel (Completed)   CBC with Differential/Platelet (Completed)   TSH (Completed)   Comprehensive metabolic panel (Completed)    Other Visit Diagnoses    Preop cardiovascular exam       Relevant Orders   EKG 12-Lead (Completed)      Follow-up: Return in about 6 months (around 11/18/2020), or if symptoms worsen or fail to improve, for hypertension, hyperlipidemia.  [name], DO

## 2020-05-21 NOTE — Patient Instructions (Signed)

## 2020-05-22 ENCOUNTER — Encounter: Payer: Self-pay | Admitting: Family Medicine

## 2020-05-22 LAB — CBC WITH DIFFERENTIAL/PLATELET
Basophils Absolute: 0 10*3/uL (ref 0.0–0.1)
Basophils Relative: 0.6 % (ref 0.0–3.0)
Eosinophils Absolute: 0.2 10*3/uL (ref 0.0–0.7)
Eosinophils Relative: 2.6 % (ref 0.0–5.0)
HCT: 43.5 % (ref 39.0–52.0)
Hemoglobin: 15.2 g/dL (ref 13.0–17.0)
Lymphocytes Relative: 24.7 % (ref 12.0–46.0)
Lymphs Abs: 1.8 10*3/uL (ref 0.7–4.0)
MCHC: 35 g/dL (ref 30.0–36.0)
MCV: 93.5 fl (ref 78.0–100.0)
Monocytes Absolute: 0.6 10*3/uL (ref 0.1–1.0)
Monocytes Relative: 8.1 % (ref 3.0–12.0)
Neutro Abs: 4.8 10*3/uL (ref 1.4–7.7)
Neutrophils Relative %: 64 % (ref 43.0–77.0)
Platelets: 184 10*3/uL (ref 150.0–400.0)
RBC: 4.65 Mil/uL (ref 4.22–5.81)
RDW: 12.9 % (ref 11.5–15.5)
WBC: 7.4 10*3/uL (ref 4.0–10.5)

## 2020-05-22 LAB — COMPREHENSIVE METABOLIC PANEL
ALT: 37 U/L (ref 0–53)
AST: 35 U/L (ref 0–37)
Albumin: 4.8 g/dL (ref 3.5–5.2)
Alkaline Phosphatase: 48 U/L (ref 39–117)
BUN: 13 mg/dL (ref 6–23)
CO2: 30 mEq/L (ref 19–32)
Calcium: 9.8 mg/dL (ref 8.4–10.5)
Chloride: 104 mEq/L (ref 96–112)
Creatinine, Ser: 0.82 mg/dL (ref 0.40–1.50)
GFR: 95.42 mL/min (ref >60.00)
Glucose, Bld: 98 mg/dL (ref 70–99)
Potassium: 4.4 mEq/L (ref 3.5–5.1)
Sodium: 139 mEq/L (ref 135–145)
Total Bilirubin: 1.3 mg/dL — ABNORMAL HIGH (ref 0.2–1.2)
Total Protein: 7 g/dL (ref 6.0–8.3)

## 2020-05-22 LAB — LIPID PANEL
Cholesterol: 123 mg/dL (ref 0–200)
HDL: 47.1 mg/dL (ref >39.00)
LDL Cholesterol: 42 mg/dL (ref 0–99)
NonHDL: 76.14
Total CHOL/HDL Ratio: 3
Triglycerides: 172 mg/dL — ABNORMAL HIGH (ref 0.0–149.0)
VLDL: 34.4 mg/dL (ref 0.0–40.0)

## 2020-05-22 LAB — MICROALBUMIN / CREATININE URINE RATIO
Creatinine,U: 29.8 mg/dL
Microalb Creat Ratio: 2.3 mg/g (ref 0.0–30.0)
Microalb, Ur: <0.7 mg/dL (ref 0.0–1.9)

## 2020-05-22 LAB — TSH: TSH: 2.1 u[IU]/mL (ref 0.35–4.50)

## 2020-05-22 NOTE — Assessment & Plan Note (Signed)
Tolerating statin, encouraged heart healthy diet, avoid trans fats, minimize simple carbs and saturated fats. Increase exercise as tolerated 

## 2020-05-22 NOTE — Assessment & Plan Note (Addendum)
Well controlled, no changes to meds. Encouraged heart healthy diet such as the DASH diet and exercise as tolerated.  °

## 2020-05-22 NOTE — Assessment & Plan Note (Signed)
ghm utd Pt had 2/3 covid vaccines  See avs

## 2020-07-02 ENCOUNTER — Other Ambulatory Visit: Payer: Self-pay

## 2020-07-02 ENCOUNTER — Ambulatory Visit: Payer: BC Managed Care – PPO | Admitting: Pulmonary Disease

## 2020-07-02 ENCOUNTER — Encounter: Payer: Self-pay | Admitting: Pulmonary Disease

## 2020-07-02 DIAGNOSIS — G4733 Obstructive sleep apnea (adult) (pediatric): Secondary | ICD-10-CM | POA: Diagnosis not present

## 2020-07-02 NOTE — Progress Notes (Signed)
   Subjective:    Patient ID: Raymond Howell, male    DOB: [birth year], 60 y.o.   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   We got him a new Respironics machine last office visit, 2016 cm auto CPAP and on follow-up download seems like events are better controlled with residual 7/hour with maximum pressure close to 18 cm.  He is tolerating this pressure well. He wonders if he could try nasal mask, he has been compliant with full face. No problems with pressure. Wakes feeling rested and denies sleep pressure or somnolence  He also has questions about the inspire device  Significant tests/ events reviewed  PSG2004 (205 lbs )- AHI was 12/ , did not need treatment.  04/2010 PSG (240 lbs ) >>AHI 22/h , lowest desatn 87%, PLMs disappeared with PAP. Central apneas emerged at 11 cm &persisted on BiPAP 13/9.   Review of Systems neg for any significant sore throat, dysphagia, itching, sneezing, nasal congestion or excess/ purulent secretions, fever, chills, sweats, unintended wt loss, pleuritic or exertional cp, hempoptysis, orthopnea pnd or change in chronic leg swelling. Also denies presyncope, palpitations, heartburn, abdominal pain, nausea, vomiting, diarrhea or change in bowel or urinary habits, dysuria,hematuria, rash, arthralgias, visual complaints, headache, numbness weakness or ataxia.     Objective:   Physical Exam  Gen. Pleasant, obese, in no distress ENT - no lesions, no post nasal drip Neck: No JVD, no thyromegaly, no carotid bruits Lungs: no use of accessory muscles, no dullness to percussion, decreased without rales or rhonchi  Cardiovascular: Rhythm regular, heart sounds  normal, no murmurs or gallops, no peripheral edema Musculoskeletal: No deformities, no cyanosis or clubbing , no tremors        Assessment & Plan:

## 2020-07-02 NOTE — Assessment & Plan Note (Signed)
CPAP download was reviewed which showed residual AHI of 10.5/hour with average pressure of 17 cm on auto settings 12 to 18 cm. I will not try to correct this further because I feel increasing his minimal auto pressure may decrease his tolerance. Regarding mask I told him to try AirFit F30 full facemask.  He can try a nasal mask but probably should not try nasal pillows since high pressure required. He is very compliant and CPAP is certainly helped improve his daytime somnolence and fatigue.  We also discussed alternative technology including inspire device, his BMI would have to be less than 32 for [place] to further pursue this

## 2020-07-02 NOTE — Patient Instructions (Addendum)
Trial of airfit F30 full face mask -medium We discussed [employer] technology , BMI 32 required to qualify

## 2020-11-14 ENCOUNTER — Encounter: Payer: Self-pay | Admitting: Family Medicine

## 2020-11-19 ENCOUNTER — Other Ambulatory Visit: Payer: BC Managed Care – PPO

## 2020-11-30 ENCOUNTER — Telehealth: Payer: Self-pay | Admitting: *Deleted

## 2020-11-30 NOTE — Telephone Encounter (Signed)
Pt is scheduled for a lab appointment on Monday.  Last lab results say to repeat labs in 6 months but Office note from January says to follow up in the office in 6 months. Pt needs an office visit with Dr Carollee Herter and can get his labs completed at that visit. Left message for pt to return my call. Please cancel lab appointment and schedule OV with Dr Carollee Herter instead.

## 2020-12-03 ENCOUNTER — Ambulatory Visit: Payer: BC Managed Care – PPO | Admitting: Family Medicine

## 2020-12-03 ENCOUNTER — Other Ambulatory Visit: Payer: BC Managed Care – PPO

## 2020-12-03 ENCOUNTER — Encounter: Payer: Self-pay | Admitting: Family Medicine

## 2020-12-03 ENCOUNTER — Other Ambulatory Visit: Payer: Self-pay

## 2020-12-03 VITALS — BP 140/90 | HR 46 | Temp 97.6°F | Resp 18 | Ht 70.0 in | Wt 243.2 lb

## 2020-12-03 DIAGNOSIS — E785 Hyperlipidemia, unspecified: Secondary | ICD-10-CM

## 2020-12-03 DIAGNOSIS — I10 Essential (primary) hypertension: Secondary | ICD-10-CM | POA: Diagnosis not present

## 2020-12-03 NOTE — Patient Instructions (Signed)
https://www.nhlbi.nih.gov/files/docs/public/heart/dash_brief.pdf">  DASH Eating Plan DASH stands for Dietary Approaches to Stop Hypertension. The DASH eating plan is a healthy eating plan that has been shown to: Reduce high blood pressure (hypertension). Reduce your risk for type 2 diabetes, heart disease, and stroke. Help with weight loss. What are tips for following this plan? Reading food labels Check food labels for the amount of salt (sodium) per serving. Choose foods with less than 5 percent of the Daily Value of sodium. Generally, foods with less than 300 milligrams (mg) of sodium per serving fit into this eating plan. To find whole grains, look for the word "whole" as the first word in the ingredient list. Shopping Buy products labeled as "low-sodium" or "no salt added." Buy fresh foods. Avoid canned foods and pre-made or frozen meals. Cooking Avoid adding salt when cooking. Use salt-free seasonings or herbs instead of table salt or sea salt. Check with your health care provider or pharmacist before using salt substitutes. Do not fry foods. Cook foods using healthy methods such as baking, boiling, grilling, roasting, and broiling instead. Cook with heart-healthy oils, such as olive, canola, avocado, soybean, or sunflower oil. Meal planning  Eat a balanced diet that includes: 4 or more servings of fruits and 4 or more servings of vegetables each day. Try to fill one-half of your plate with fruits and vegetables. 6-8 servings of whole grains each day. Less than 6 oz (170 g) of lean meat, poultry, or fish each day. A 3-oz (85-g) serving of meat is about the same size as a deck of cards. One egg equals 1 oz (28 g). 2-3 servings of low-fat dairy each day. One serving is 1 cup (237 mL). 1 serving of nuts, seeds, or beans 5 times each week. 2-3 servings of heart-healthy fats. Healthy fats called omega-3 fatty acids are found in foods such as walnuts, flaxseeds, fortified milks, and eggs.  These fats are also found in cold-water fish, such as sardines, salmon, and mackerel. Limit how much you eat of: Canned or prepackaged foods. Food that is high in trans fat, such as some fried foods. Food that is high in saturated fat, such as fatty meat. Desserts and other sweets, sugary drinks, and other foods with added sugar. Full-fat dairy products. Do not salt foods before eating. Do not eat more than 4 egg yolks a week. Try to eat at least 2 vegetarian meals a week. Eat more home-cooked food and less restaurant, buffet, and fast food.  Lifestyle When eating at a restaurant, ask that your food be prepared with less salt or no salt, if possible. If you drink alcohol: Limit how much you use to: 0-1 drink a day for women who are not pregnant. 0-2 drinks a day for men. Be aware of how much alcohol is in your drink. In the [place] one drink equals one 12 oz bottle of beer (355 mL), one 5 oz glass of wine (148 mL), or one 1 oz glass of hard liquor (44 mL). General information Avoid eating more than 2,300 mg of salt a day. If you have hypertension, you may need to reduce your sodium intake to 1,500 mg a day. Work with your health care provider to maintain a healthy body weight or to lose weight. Ask what an ideal weight is for you. Get at least 30 minutes of exercise that causes your heart to beat faster (aerobic exercise) most days of the week. Activities may include walking, swimming, or biking. Work with your health care provider   or dietitian to adjust your eating plan to your individual calorie needs. What foods should I eat? Fruits All fresh, dried, or frozen fruit. Canned fruit in natural juice (without addedsugar). Vegetables Fresh or frozen vegetables (raw, steamed, roasted, or grilled). Low-sodium or reduced-sodium tomato and vegetable juice. Low-sodium or reduced-sodium tomatosauce and tomato paste. Low-sodium or reduced-sodium canned vegetables. Grains Whole-grain or  whole-wheat bread. Whole-grain or whole-wheat pasta. Brown rice. Oatmeal. Quinoa. Bulgur. Whole-grain and low-sodium cereals. Pita bread.Low-fat, low-sodium crackers. Whole-wheat flour tortillas. Meats and other proteins Skinless chicken or turkey. Ground chicken or turkey. Pork with fat trimmed off. Fish and seafood. Egg whites. Dried beans, peas, or lentils. Unsalted nuts, nut butters, and seeds. Unsalted canned beans. Lean cuts of beef with fat trimmed off. Low-sodium, lean precooked or cured meat, such as sausages or meatloaves. Dairy Low-fat (1%) or fat-free (skim) milk. Reduced-fat, low-fat, or fat-free cheeses. Nonfat, low-sodium ricotta or cottage cheese. Low-fat or nonfatyogurt. Low-fat, low-sodium cheese. Fats and oils Soft margarine without trans fats. Vegetable oil. Reduced-fat, low-fat, or light mayonnaise and salad dressings (reduced-sodium). Canola, safflower, olive, avocado, soybean, andsunflower oils. Avocado. Seasonings and condiments Herbs. Spices. Seasoning mixes without salt. Other foods Unsalted popcorn and pretzels. Fat-free sweets. The items listed above may not be a complete list of foods and beverages you can eat. Contact a dietitian for more information. What foods should I avoid? Fruits Canned fruit in a light or heavy syrup. Fried fruit. Fruit in cream or buttersauce. Vegetables Creamed or fried vegetables. Vegetables in a cheese sauce. Regular canned vegetables (not low-sodium or reduced-sodium). Regular canned tomato sauce and paste (not low-sodium or reduced-sodium). Regular tomato and vegetable juice(not low-sodium or reduced-sodium). Pickles. Olives. Grains Baked goods made with fat, such as croissants, muffins, or some breads. Drypasta or rice meal packs. Meats and other proteins Fatty cuts of meat. Ribs. Fried meat. Bacon. Bologna, salami, and other precooked or cured meats, such as sausages or meat loaves. Fat from the back of a pig (fatback). Bratwurst.  Salted nuts and seeds. Canned beans with added salt. Canned orsmoked fish. Whole eggs or egg yolks. Chicken or turkey with skin. Dairy Whole or 2% milk, cream, and half-and-half. Whole or full-fat cream cheese. Whole-fat or sweetened yogurt. Full-fat cheese. Nondairy creamers. Whippedtoppings. Processed cheese and cheese spreads. Fats and oils Butter. Stick margarine. Lard. Shortening. Ghee. Bacon fat. Tropical oils, suchas coconut, palm kernel, or palm oil. Seasonings and condiments Onion salt, garlic salt, seasoned salt, table salt, and sea salt. Worcestershire sauce. Tartar sauce. Barbecue sauce. Teriyaki sauce. Soy sauce, including reduced-sodium. Steak sauce. Canned and packaged gravies. Fish sauce. Oyster sauce. Cocktail sauce. Store-bought horseradish. Ketchup. Mustard. Meat flavorings and tenderizers. Bouillon cubes. Hot sauces. Pre-made or packaged marinades. Pre-made or packaged taco seasonings. Relishes. Regular saladdressings. Other foods Salted popcorn and pretzels. The items listed above may not be a complete list of foods and beverages you should avoid. Contact a dietitian for more information. Where to find more information National Heart, Lung, and Blood Institute: www.nhlbi.nih.gov American Heart Association: www.heart.org Academy of Nutrition and Dietetics: www.eatright.org National Kidney Foundation: www.kidney.org Summary The DASH eating plan is a healthy eating plan that has been shown to reduce high blood pressure (hypertension). It may also reduce your risk for type 2 diabetes, heart disease, and stroke. When on the DASH eating plan, aim to eat more fresh fruits and vegetables, whole grains, lean proteins, low-fat dairy, and heart-healthy fats. With the DASH eating plan, you should limit salt (sodium) intake to 2,300   mg a day. If you have hypertension, you may need to reduce your sodium intake to 1,500 mg a day. Work with your health care provider or dietitian to adjust  your eating plan to your individual calorie needs. This information is not intended to replace advice given to you by your health care provider. Make sure you discuss any questions you have with your healthcare provider. Document Revised: 04/01/2019 Document Reviewed: 04/01/2019 Elsevier Patient Education  2022 Elsevier Inc.  

## 2020-12-03 NOTE — Assessment & Plan Note (Signed)
Poorly controlled will alter medications, encouraged DASH diet, minimize caffeine and obtain adequate sleep. Report concerning symptoms and follow up as directed and as needed Pt had salty meal last night Pt states bp runs better at home F/u with nurse visit in 2-3 weeks

## 2020-12-03 NOTE — Progress Notes (Signed)
Patient ID: Raymond Howell, male    DOB: 1960/09/23  Age: 60 y.o. MRN: [id number]    Subjective:  Subjective  HPI Raymond Howell presents for an office visit and f/u HTN and HLD today.  He complains of anxiety secondary to work. He endorses taking 100 mg TOPROL-XL PO QD for his dx of HTN. He reports that his at home Bp is 120-130/75-85. He states that he had ate pre-marinates tri-tip last night.  BP Readings from Last 3 Encounters:  12/03/20 140/90  07/02/20 122/82  05/21/20 (!) 142/90  He denies any chest pain, SOB, fever, abdominal pain, cough, chills, sore throat, dysuria, urinary incontinence, back pain, HA, or N/V/D at this time.   Review of Systems  Constitutional:  Negative for chills, fatigue and fever.  HENT:  Negative for ear pain, rhinorrhea, sinus pressure, sinus pain, sore throat and tinnitus.   Eyes:  Negative for pain.  Respiratory:  Negative for cough, shortness of breath and wheezing.   Cardiovascular:  Negative for chest pain and leg swelling.  Gastrointestinal:  Negative for abdominal pain, anal bleeding, constipation, diarrhea, nausea and vomiting.  Genitourinary:  Negative for flank pain.  Musculoskeletal:  Negative for back pain and neck pain.  Skin:  Negative for rash.  Neurological:  Negative for seizures, weakness, light-headedness, numbness and headaches.  Psychiatric/Behavioral:  The patient is nervous/anxious (secondary to work).    History Past Medical History:  Diagnosis Date   Angiodysplasia of colon    Basal cell carcinoma of nose    Chest pain    Family history of heart disease    Hyperlipidemia    Hypertension    Sleep apnea    CPAP   Umbilical hernia    Venereal wart     He has a past surgical history that includes Hernia repair (2004); Mohs surgery; Mouth surgery; Appendectomy (1987); Colonoscopy; and Tooth extraction.   His family history includes Cancer in an other family member; Coronary artery disease in an other family member; Heart  attack (age of onset: 73) in his maternal grandfather; Heart disease (age of onset: 79) in his father.He reports that he has never smoked. He has never used smokeless tobacco. He reports current alcohol use of about 3.0 - 4.0 standard drinks of alcohol per week. He reports that he does not use drugs.  Current Outpatient Medications on File Prior to Visit  Medication Sig Dispense Refill   aspirin 81 MG tablet Take 81 mg by mouth daily.     Flaxseed, Linseed, (FLAXSEED OIL) 1000 MG CAPS Take by mouth 2 (two) times daily.     meloxicam (MOBIC) 15 MG tablet Take 1 tablet (15 mg total) by mouth daily. (Patient taking differently: Take 15 mg by mouth as needed.) 90 tablet 1   metoprolol succinate (TOPROL-XL) 100 MG 24 hr tablet Take 1 tablet (100 mg total) by mouth daily. 90 tablet 3   Multiple Vitamin (MULTIVITAMIN) tablet Take 1 tablet by mouth daily.     Niacin CR 1000 MG TBCR Take 1 tablet (1,000 mg total) by mouth daily. 90 tablet 3   NON FORMULARY at bedtime. CPAP     simvastatin (ZOCOR) 40 MG tablet Take 1 tablet (40 mg total) by mouth at bedtime. 90 tablet 3   No current facility-administered medications on file prior to visit.     Objective:  Objective  Physical Exam Vitals and nursing note reviewed.  Constitutional:      General: He is not in acute distress.  Appearance: Normal appearance. He is well-developed. He is not ill-appearing.  HENT:     Head: Normocephalic and atraumatic.     Right Ear: External ear normal.     Left Ear: External ear normal.     Nose: Nose normal.  Eyes:     General:        Right eye: No discharge.        Left eye: No discharge.     Extraocular Movements: Extraocular movements intact.     Pupils: Pupils are equal, round, and reactive to light.  Cardiovascular:     Rate and Rhythm: Normal rate and regular rhythm.     Pulses: Normal pulses.     Heart sounds: Normal heart sounds. No murmur heard.   No friction rub. No gallop.  Pulmonary:      Effort: Pulmonary effort is normal. No respiratory distress.     Breath sounds: Normal breath sounds. No stridor. No wheezing, rhonchi or rales.  Chest:     Chest wall: No tenderness.  Abdominal:     General: Bowel sounds are normal. There is no distension.     Palpations: Abdomen is soft. There is no mass.     Tenderness: There is no abdominal tenderness. There is no guarding or rebound.     Hernia: No hernia is present.  Musculoskeletal:        General: Normal range of motion.     Cervical back: Normal range of motion and neck supple.     Right lower leg: No edema.     Left lower leg: No edema.  Skin:    General: Skin is warm and dry.  Neurological:     Mental Status: He is alert and oriented to person, place, and time.  Psychiatric:        Behavior: Behavior normal.        Thought Content: Thought content normal.   BP 140/90   Pulse (!) 46   Temp 97.6 F (36.4 C) (Oral)   Resp 18   Ht '5\' 10"'$  (1.778 m)   Wt 243 lb 3.2 oz (110.3 kg)   SpO2 96%   BMI 34.90 kg/m  Wt Readings from Last 3 Encounters:  12/03/20 243 lb 3.2 oz (110.3 kg)  07/02/20 247 lb 3.2 oz (112.1 kg)  05/21/20 246 lb (111.6 kg)     Lab Results  Component Value Date   WBC 7.4 05/21/2020   HGB 15.2 05/21/2020   HCT 43.5 05/21/2020   PLT 184.0 05/21/2020   GLUCOSE 98 05/21/2020   CHOL 123 05/21/2020   TRIG 172.0 (H) 05/21/2020   HDL 47.10 05/21/2020   LDLCALC 42 05/21/2020   ALT 37 05/21/2020   AST 35 05/21/2020   NA 139 05/21/2020   K 4.4 05/21/2020   CL 104 05/21/2020   CREATININE 0.82 05/21/2020   BUN 13 05/21/2020   CO2 30 05/21/2020   TSH 2.10 05/21/2020   PSA 1.5 03/18/2016   HGBA1C 4.9 12/03/2017   MICROALBUR <0.7 05/21/2020    DG Foot Complete Right  Result Date: 02/24/2017 CLINICAL DATA:  RT foot dorsal ganglion of mid foot at base of 2nd MT npticed about 3-4 months ago but has grown. Hyperechoic area on Ultrasound assess for foreign body. No old or recent injury EXAM: RIGHT  FOOT COMPLETE - 3+ VIEW COMPARISON:  None. FINDINGS: No fracture or dislocation of mid foot or forefoot. The phalanges are normal. The calcaneus is normal. Enthesopathic spurring along the plantar aspect  of the calcaneus. No soft tissue abnormality. IMPRESSION: No acute osseous abnormality. Electronically Signed   By: Suzy Bouchard M.D.   On: 02/24/2017 16:16     Assessment & Plan:  Plan   No orders of the defined types were placed in this encounter.   Problem List Items Addressed This Visit       Unprioritized   Essential hypertension    Poorly controlled will alter medications, encouraged DASH diet, minimize caffeine and obtain adequate sleep. Report concerning symptoms and follow up as directed and as needed Pt had salty meal last night Pt states bp runs better at home F/u with nurse visit in 2-3 weeks        Hyperlipidemia - Primary    Encourage heart healthy diet such as MIND or DASH diet, increase exercise, avoid trans fats, simple carbohydrates and processed foods, consider a krill or fish or flaxseed oil cap daily.        Relevant Orders   Lipid panel   Comprehensive metabolic panel   Other Visit Diagnoses     Primary hypertension           Follow-up: Return in about 3 weeks (around 12/24/2020), or nurse visit for bp check and f/u in 6 months.   I,Gordon Zheng,acting as a Education administrator for [employer], DO.,have documented all relevant documentation on the behalf of [name], DO,as directed by  [name], DO while in the presence of [place], DO, have reviewed all documentation for this visit. The documentation on 12/03/20 for the exam, diagnosis, procedures, and orders are all accurate and complete.

## 2020-12-03 NOTE — Assessment & Plan Note (Signed)
Encourage heart healthy diet such as MIND or DASH diet, increase exercise, avoid trans fats, simple carbohydrates and processed foods, consider a krill or fish or flaxseed oil cap daily.  °

## 2020-12-04 ENCOUNTER — Encounter: Payer: Self-pay | Admitting: Family Medicine

## 2020-12-04 LAB — COMPREHENSIVE METABOLIC PANEL
ALT: 27 U/L (ref 0–53)
AST: 30 U/L (ref 0–37)
Albumin: 4.7 g/dL (ref 3.5–5.2)
Alkaline Phosphatase: 54 U/L (ref 39–117)
BUN: 16 mg/dL (ref 6–23)
CO2: 29 mEq/L (ref 19–32)
Calcium: 9.7 mg/dL (ref 8.4–10.5)
Chloride: 103 mEq/L (ref 96–112)
Creatinine, Ser: 0.77 mg/dL (ref 0.40–1.50)
GFR: 96.89 mL/min (ref >60.00)
Glucose, Bld: 94 mg/dL (ref 70–99)
Potassium: 4.6 mEq/L (ref 3.5–5.1)
Sodium: 140 mEq/L (ref 135–145)
Total Bilirubin: 1.6 mg/dL — ABNORMAL HIGH (ref 0.2–1.2)
Total Protein: 7 g/dL (ref 6.0–8.3)

## 2020-12-04 LAB — LIPID PANEL
Cholesterol: 131 mg/dL (ref 0–200)
HDL: 43.1 mg/dL (ref >39.00)
LDL Cholesterol: 56 mg/dL (ref 0–99)
NonHDL: 87.4
Total CHOL/HDL Ratio: 3
Triglycerides: 155 mg/dL — ABNORMAL HIGH (ref 0.0–149.0)
VLDL: 31 mg/dL (ref 0.0–40.0)

## 2021-02-28 NOTE — Telephone Encounter (Signed)
RA - please advise. Thanks! 

## 2021-03-02 ENCOUNTER — Telehealth: Payer: BC Managed Care – PPO | Admitting: Nurse Practitioner

## 2021-03-02 DIAGNOSIS — J00 Acute nasopharyngitis [common cold]: Secondary | ICD-10-CM

## 2021-03-02 MED ORDER — AMOXICILLIN-POT CLAVULANATE 875-125 MG PO TABS: 1.0000 | ORAL_TABLET | Freq: Two times a day (BID) | ORAL | 0 refills | Status: DC

## 2021-03-02 NOTE — Progress Notes (Signed)
Virtual Visit Consent   Raymond Howell, you are scheduled for a virtual visit with Mary-Margaret Hassell Done, [place], a [facility] provider, today.     Just as with appointments in the office, your consent must be obtained to participate.  Your consent will be active for this visit and any virtual visit you may have with one of our providers in the next 365 days.     If you have a MyChart account, a copy of this consent can be sent to you electronically.  All virtual visits are billed to your insurance company just like a traditional visit in the office.    As this is a virtual visit, video technology does not allow for your provider to perform a traditional examination.  This may limit your provider's ability to fully assess your condition.  If your provider identifies any concerns that need to be evaluated in person or the need to arrange testing (such as labs, EKG, etc.), we will make arrangements to do so.     Although advances in technology are sophisticated, we cannot ensure that it will always work on either your end or our end.  If the connection with a video visit is poor, the visit may have to be switched to a telephone visit.  With either a video or telephone visit, we are not always able to ensure that we have a secure connection.     I need to obtain your verbal consent now.   Are you willing to proceed with your visit today? YES   Raymond Howell has provided verbal consent on 03/02/2021 for a virtual visit (video or telephone).   Mary-Margaret Hassell Done, FNP   Date: 03/02/2021 10:12 AM   Virtual Visit via Video Note   I, Mary-Margaret Hassell Done, connected with Raymond Howell ([id number], 12-25-1960) on 03/02/21 at 10:15 AM EDT by a video-enabled telemedicine application and verified that I am speaking with the correct person using two identifiers.  Location: Patient: Virtual Visit Location Patient: Home Provider: Virtual Visit Location Provider: Mobile   I discussed the limitations of  evaluation and management by telemedicine and the availability of in person appointments. The patient expressed understanding and agreed to proceed.    History of Present Illness: Raymond Howell is a 60 y.o. who identifies as a male who was assigned male at birth, and is being seen today for sinusitis.  HPI: Patient states that last weekend after cutting the grass he developed a stuffy nose which is not unusual for him. Ythis past Friday the mucus in his nose went from yellow to green. Has not done that in the oast. He also has slight cough. He is getting ready to go out of town for a couple of weeks. Only thing he has been exposed to is strep throat that his grandson was diagnosed with  Review of Systems  Constitutional:  Negative for chills and fever.  HENT:  Positive for congestion. Negative for sinus pain and sore throat.   Respiratory:  Positive for cough. Negative for sputum production.   Musculoskeletal:  Negative for myalgias.  Neurological:  Negative for headaches.   Problems:  Patient Active Problem List   Diagnosis Date Noted   Plantar fasciitis, left 06/26/2017   Ganglion cyst of right foot 03/03/2017   Preventative health care 03/13/2015   Hyperglycemia 09/05/2013   Angiodysplasia of cecum 07/12/2013   Chest pain 04/05/2013   Obesity (BMI 30-39.9) 03/08/2013   Obstructive sleep apnea 02/25/2010   VENEREAL  WART 02/02/2007   BASAL CELL CARCINOMA, NOSE 02/02/2007   Hyperlipidemia 02/02/2007   Essential hypertension 38/25/0539   HERNIA, UMBILICAL 76/73/4193   BASAL CELL CARCINOMA, NOSE 02/02/2007    Allergies: No Known Allergies Medications:  Current Outpatient Medications:    aspirin 81 MG tablet, Take 81 mg by mouth daily., Disp: , Rfl:    Flaxseed, Linseed, (FLAXSEED OIL) 1000 MG CAPS, Take by mouth 2 (two) times daily., Disp: , Rfl:    meloxicam (MOBIC) 15 MG tablet, Take 1 tablet (15 mg total) by mouth daily. (Patient taking differently: Take 15 mg by mouth as  needed.), Disp: 90 tablet, Rfl: 1   metoprolol succinate (TOPROL-XL) 100 MG 24 hr tablet, Take 1 tablet (100 mg total) by mouth daily., Disp: 90 tablet, Rfl: 3   Multiple Vitamin (MULTIVITAMIN) tablet, Take 1 tablet by mouth daily., Disp: , Rfl:    Niacin CR 1000 MG TBCR, Take 1 tablet (1,000 mg total) by mouth daily., Disp: 90 tablet, Rfl: 3   NON FORMULARY, at bedtime. CPAP, Disp: , Rfl:    simvastatin (ZOCOR) 40 MG tablet, Take 1 tablet (40 mg total) by mouth at bedtime., Disp: 90 tablet, Rfl: 3  Observations/Objective: Patient is well-developed, well-nourished in no acute distress.  Resting comfortably  at home.  Head is normocephalic, atraumatic.  No labored breathing.  Speech is clear and coherent with logical content.  Patient is alert and oriented at baseline.    Assessment and Plan:  Raymond Howell in today with chief complaint of No chief complaint on file.   1. Acute nasopharyngitis 1. Take meds as prescribed 2. Use a cool mist humidifier especially during the winter months and when heat has been humid. 3. Use saline nose sprays frequently 4. Saline irrigations of the nose can be very helpful if done frequently.  * 4X daily for 1 week*  * Use of a nettie pot can be helpful with this. Follow directions with this* 5. Drink plenty of fluids 6. Keep thermostat turn down low 7.For any cough or congestion  Use plain Mucinex- regular strength or max strength is fine   * Children- consult with Pharmacist for dosing 8. For fever or aces or pains- take tylenol or ibuprofen appropriate for age and weight.  * for fevers greater than 101 orally you may alternate ibuprofen and tylenol every  3 hours.   Patient will not take antibiotic unless he is not better in 2-3 days with symtomatic treatment. Meds ordered this encounter  Medications   amoxicillin-clavulanate (AUGMENTIN) 875-125 MG tablet    Sig: Take 1 tablet by mouth 2 (two) times daily.    Dispense:  14 tablet    Refill:   0    Order Specific Question:   Supervising Provider    Answer:   Noemi Chapel [[id number]]      Follow Up Instructions: I discussed the assessment and treatment plan with the patient. The patient was provided an opportunity to ask questions and all were answered. The patient agreed with the plan and demonstrated an understanding of the instructions.  A copy of instructions were sent to the patient via MyChart.  The patient was advised to call back or seek an in-person evaluation if the symptoms worsen or if the condition fails to improve as anticipated.  Time:  I spent 11 minutes with the patient via telehealth technology discussing the above problems/concerns.    Mary-Margaret Hassell Done, FNP

## 2021-03-02 NOTE — Patient Instructions (Signed)
Upper Respiratory Infection, Adult An upper respiratory infection (URI) is a common viral infection of the nose, throat, and upper air passages that lead to the lungs. The most common type of URI is the common cold. URIs usually get better on their own, without medical treatment. What are the causes? A URI is caused by a virus. You may catch a virus by: Breathing in droplets from an infected person's cough or sneeze. Touching something that has been exposed to the virus (contaminated) and then touching your mouth, nose, or eyes. What increases the risk? You are more likely to get a URI if: You are very young or very old. It is autumn or winter. You have close contact with others, such as at a daycare, school, or health care facility. You smoke. You have long-term (chronic) heart or lung disease. You have a weakened disease-fighting (immune) system. You have nasal allergies or asthma. You are experiencing a lot of stress. You work in an area that has poor air circulation. You have poor nutrition. What are the signs or symptoms? A URI usually involves some of the following symptoms: Runny or stuffy (congested) nose. Sneezing. Cough. Sore throat. Headache. Fatigue. Fever. Loss of appetite. Pain in your forehead, behind your eyes, and over your cheekbones (sinus pain). Muscle aches. Redness or irritation of the eyes. Pressure in the ears or face. How is this diagnosed? This condition may be diagnosed based on your medical history and symptoms, and a physical exam. Your health care provider may use a cotton swab to take a mucus sample from your nose (nasal swab). This sample can be tested to determine what virus is causing the illness. How is this treated? URIs usually get better on their own within 7-10 days. You can take steps at home to relieve your symptoms. Medicines cannot cure URIs, but your health care provider may recommend certain medicines to help relieve symptoms, such  as: Over-the-counter cold medicines. Cough suppressants. Coughing is a type of defense against infection that helps to clear the respiratory system, so take these medicines only as recommended by your health care provider. Fever-reducing medicines. Follow these instructions at home: Activity Rest as needed. If you have a fever, stay home from work or school until your fever is gone or until your health care provider says you are no longer contagious. Your health care provider may have you wear a face mask to prevent your infection from spreading. Relieving symptoms Gargle with a salt-water mixture 3-4 times a day or as needed. To make a salt-water mixture, completely dissolve -1 tsp of salt in 1 cup of warm water. Use a cool-mist humidifier to add moisture to the air. This can help you breathe more easily. Eating and drinking  Drink enough fluid to keep your urine pale yellow. Eat soups and other clear broths. General instructions  Take over-the-counter and prescription medicines only as told by your health care provider. These include cold medicines, fever reducers, and cough suppressants. Do not use any products that contain nicotine or tobacco, such as cigarettes and e-cigarettes. If you need help quitting, ask your health care provider. Stay away from secondhand smoke. Stay up to date on all immunizations, including the yearly (annual) flu vaccine. Keep all follow-up visits as told by your health care provider. This is important. How to prevent the spread of infection to others  URIs can be passed from person to person (are contagious). To prevent the infection from spreading: Wash your hands often with soap and   water. If soap and water are not available, use hand sanitizer. Avoid touching your mouth, face, eyes, or nose. Cough or sneeze into a tissue or your sleeve or elbow instead of into your hand or into the air. Contact a health care provider if: You are getting worse instead  of better. You have a fever or chills. Your mucus is brown or red. You have yellow or brown discharge coming from your nose. You have pain in your face, especially when you bend forward. You have swollen neck glands. You have pain while swallowing. You have white areas in the back of your throat. Get help right away if: You have shortness of breath that gets worse. You have severe or persistent: Headache. Ear pain. Sinus pain. Chest pain. You have chronic lung disease along with any of the following: Wheezing. Prolonged cough. Coughing up blood. A change in your usual mucus. You have a stiff neck. You have changes in your: Vision. Hearing. Thinking. Mood. Summary An upper respiratory infection (URI) is a common infection of the nose, throat, and upper air passages that lead to the lungs. A URI is caused by a virus. URIs usually get better on their own within 7-10 days. Medicines cannot cure URIs, but your health care provider may recommend certain medicines to help relieve symptoms. This information is not intended to replace advice given to you by your health care provider. Make sure you discuss any questions you have with your health care provider. Document Revised: 01/05/2020 Document Reviewed: 01/05/2020 Elsevier Patient Education  2022 Elsevier Inc.  

## 2021-03-15 ENCOUNTER — Ambulatory Visit (INDEPENDENT_AMBULATORY_CARE_PROVIDER_SITE_OTHER): Payer: BC Managed Care – PPO

## 2021-03-15 ENCOUNTER — Encounter: Payer: Self-pay | Admitting: Family Medicine

## 2021-03-15 ENCOUNTER — Other Ambulatory Visit: Payer: Self-pay

## 2021-03-15 DIAGNOSIS — Z23 Encounter for immunization: Secondary | ICD-10-CM | POA: Diagnosis not present

## 2021-03-15 NOTE — Progress Notes (Signed)
Pt is here today for flu vaccine. Pt was given flu vaccine in left deltoid. Pt tolerated well

## 2021-05-04 ENCOUNTER — Encounter: Payer: Self-pay | Admitting: Family Medicine

## 2021-05-07 NOTE — Telephone Encounter (Signed)
This medication has not been refilled since 2020. Pt last seen 11/2020

## 2021-05-08 ENCOUNTER — Other Ambulatory Visit: Payer: Self-pay | Admitting: Family Medicine

## 2021-05-08 DIAGNOSIS — I10 Essential (primary) hypertension: Secondary | ICD-10-CM

## 2021-05-08 DIAGNOSIS — E785 Hyperlipidemia, unspecified: Secondary | ICD-10-CM

## 2021-05-08 MED ORDER — MELOXICAM 15 MG PO TABS: 15.0000 mg | ORAL_TABLET | ORAL | 1 refills | Status: DC | PRN

## 2021-05-27 ENCOUNTER — Encounter: Payer: BC Managed Care – PPO | Admitting: Family Medicine

## 2021-06-04 ENCOUNTER — Encounter: Payer: Self-pay | Admitting: Family Medicine

## 2021-06-27 ENCOUNTER — Other Ambulatory Visit: Payer: Self-pay

## 2021-06-27 ENCOUNTER — Ambulatory Visit: Payer: BC Managed Care – PPO | Admitting: Pulmonary Disease

## 2021-06-27 ENCOUNTER — Encounter: Payer: Self-pay | Admitting: Pulmonary Disease

## 2021-06-27 ENCOUNTER — Telehealth: Payer: Self-pay | Admitting: Pulmonary Disease

## 2021-06-27 DIAGNOSIS — G4733 Obstructive sleep apnea (adult) (pediatric): Secondary | ICD-10-CM | POA: Diagnosis not present

## 2021-06-27 NOTE — Telephone Encounter (Signed)
Please let him know that we reviewed report on his CPAP.  Settings are correct 12 to 18 cm but he still has residual events about 15/hour. We will change auto CPAP to 12 to 20 cm and recheck report in 1 month. If events persist, then we may have to proceed with formal titration study to see what has changed

## 2021-06-27 NOTE — Patient Instructions (Signed)
°  Congratulations on your retirement !  CPAP report & make adjustments

## 2021-06-27 NOTE — Progress Notes (Signed)
° °  Subjective:    Patient ID: Raymond Howell, male    DOB: 10-May-1961, 61 y.o.   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 He feels that the new machine settings are not right.  Wife has still noted snoring. Last visit we provided him with an AirFit F30 fullface mask but he did not like this and reverted back to his old mask  He reports some somnolence and fatigue.  He is very compliant with his machine he has been traveling for pleasure  Significant tests/ events reviewed  PSG  2004 (205 lbs )- AHI was 12/ , did not need treatment.  04/2010 PSG (240 lbs ) >> AHI 22/h , lowest desatn 87%, PLMs disappeared with PAP. Central apneas emerged at 11 cm & persisted on BiPAP 13/9.  Review of Systems neg for any significant sore throat, dysphagia, itching, sneezing, nasal congestion or excess/ purulent secretions, fever, chills, sweats, unintended wt loss, pleuritic or exertional cp, hempoptysis, orthopnea pnd or change in chronic leg swelling. Also denies presyncope, palpitations, heartburn, abdominal pain, nausea, vomiting, diarrhea or change in bowel or urinary habits, dysuria,hematuria, rash, arthralgias, visual complaints, headache, numbness weakness or ataxia.     Objective:   Physical Exam  Gen. Pleasant, obese, in no distress ENT - no lesions, no post nasal drip Neck: No JVD, no thyromegaly, no carotid bruits Lungs: no use of accessory muscles, no dullness to percussion, decreased without rales or rhonchi  Cardiovascular: Rhythm regular, heart sounds  normal, no murmurs or gallops, no peripheral edema Musculoskeletal: No deformities, no cyanosis or clubbing , no tremors       Assessment & Plan:

## 2021-06-27 NOTE — Assessment & Plan Note (Signed)
CPAP download was reviewed for the last month on his new machine which shows average pressure of 16 cm with peak pressure of 18 cm on auto CPAP settings 12 to 18 cm with residual AHI of 15/hour mainly obstructive events.  We will adjust auto CPAP to 20 cm and repeat download in 1 month, if events persist then we will consider proceeding with a formal titration study  Weight loss encouraged, compliance with goal of at least 4-6 hrs every night is the expectation. Advised against medications with sedative side effects Cautioned against driving when sleepy - understanding that sleepiness will vary on a day to day basis  Also discussed alternative therapy of hypoglossal nerve stimulation

## 2021-08-01 ENCOUNTER — Encounter: Payer: BC Managed Care – PPO | Admitting: Family Medicine

## 2021-08-08 ENCOUNTER — Encounter: Payer: Self-pay | Admitting: Family Medicine

## 2021-08-08 ENCOUNTER — Ambulatory Visit (INDEPENDENT_AMBULATORY_CARE_PROVIDER_SITE_OTHER): Payer: BC Managed Care – PPO | Admitting: Family Medicine

## 2021-08-08 VITALS — BP 116/78 | HR 52 | Temp 97.8°F | Resp 18 | Ht 70.0 in | Wt 242.0 lb

## 2021-08-08 DIAGNOSIS — E785 Hyperlipidemia, unspecified: Secondary | ICD-10-CM | POA: Diagnosis not present

## 2021-08-08 DIAGNOSIS — Z0000 Encounter for general adult medical examination without abnormal findings: Secondary | ICD-10-CM

## 2021-08-08 DIAGNOSIS — Z125 Encounter for screening for malignant neoplasm of prostate: Secondary | ICD-10-CM | POA: Diagnosis not present

## 2021-08-08 DIAGNOSIS — I10 Essential (primary) hypertension: Secondary | ICD-10-CM | POA: Diagnosis not present

## 2021-08-08 MED ORDER — SIMVASTATIN 40 MG PO TABS: 40.0000 mg | ORAL_TABLET | Freq: Every day | ORAL | 3 refills | Status: AC

## 2021-08-08 MED ORDER — METOPROLOL SUCCINATE ER 100 MG PO TB24: 100.0000 mg | ORAL_TABLET | Freq: Every day | ORAL | 3 refills | Status: AC

## 2021-08-08 MED ORDER — NIACIN ER (ANTIHYPERLIPIDEMIC) 1000 MG PO TBCR: 1000.0000 mg | EXTENDED_RELEASE_TABLET | Freq: Every day | ORAL | 3 refills | Status: AC

## 2021-08-08 NOTE — Patient Instructions (Signed)

## 2021-08-08 NOTE — Progress Notes (Signed)
? ?Subjective:  ? ?By signing my name below, I, Raymond Howell, attest that this documentation has been prepared under the direction and in the presence of [name], DO. 08/08/2021 ? ? ? Patient ID: Raymond Howell, male    DOB: Aug 27, 1960, 61 y.o.   MRN: [id number] ? ?Chief Complaint  ?Patient presents with  ? Annual Exam  ?  Pt states fasting   ? ? ?HPI ?Patient is in today for a comprehensive physical exam.  ? ?He denies having any fever, new muscle pain, new joint pain, new moles, congestion, sinus pain, sore throat, chest pain, palpations, cough, SOB, wheezing, n/v/d, constipation, blood in stool, dysuria, frequency, hematuria, or headaches at this time. ?He is requesting a refill on 40 mg simvastatin and 100 mg metoprolol succinate.  ?His blood pressure is elevated during this visit. He measured it at home prior to the visit and it measured 135/85. It measured 130/80 this morning. He is inquiring if his metoprolol succinate dose is too high due to his pulse being in the low 50's.  ?BP Readings from Last 3 Encounters:  ?08/08/21 116/78  ?06/27/21 120/72  ?12/03/20 140/90  ? ?Pulse Readings from Last 3 Encounters:  ?08/08/21 (!) 52  ?06/27/21 62  ?12/03/20 (!) 46  ? ?He continues using a CPAP machine and reports no new issues while using it.  ?He does not see his cardiologist regularly at this time. He continues seeing his urologist regularly. He continues seeing his pulmonologist regularly.  ?He has no change to his family medical history. He reports having a right knee torn meniscus repair procedure around February 2022, otherwise he has no recent surgical procedures.  ?He is completing lab work during this visit. He is fasting at this time.  ?He has 2 pfizer Covid-19 vaccines at this time. He is UTD on flu vaccine this year. He is UTD on both shingrix vaccines. He is UTD with tetanus vaccine.  ?He is UTD on vision care. He is UTD on dental care.  ? ? ?Past Medical History:  ?Diagnosis Date  ?  Angiodysplasia of colon   ? Basal cell carcinoma of nose   ? Chest pain   ? Family history of heart disease   ? Hyperlipidemia   ? Hypertension   ? Sleep apnea   ? CPAP  ? Umbilical hernia   ? Venereal wart   ? ? ?Past Surgical History:  ?Procedure Laterality Date  ? APPENDECTOMY  1987  ? COLONOSCOPY    ? HERNIA REPAIR  5027  ? umbilical  ? KNEE ARTHROSCOPY Right   ? MOHS SURGERY    ? nose  ? MOUTH SURGERY    ? wisdom teeth  ? TOOTH EXTRACTION    ? ? ?Family History  ?Problem Relation Age of Onset  ? Heart disease Father 23  ?     MI died @ 32  ? Heart attack Maternal Grandfather 42  ? Coronary artery disease Other   ?     1st degree relative  ? Cancer Other   ?     skin  ? Colon cancer Neg Hx   ? Stomach cancer Neg Hx   ? ? ?Social History  ? ?Socioeconomic History  ? Marital status: Married  ?  Spouse name: Not on file  ? Number of children: Not on file  ? Years of education: Not on file  ? Highest education level: Not on file  ?Occupational History  ? Not on file  ?  Tobacco Use  ? Smoking status: Never  ? Smokeless tobacco: Never  ?Substance and Sexual Activity  ? Alcohol use: Yes  ?  Alcohol/week: 3.0 - 4.0 standard drinks  ?  Types: 3 - 4 Standard drinks or equivalent per week  ?  Comment: per week  ? Drug use: No  ? Sexual activity: Yes  ?  Partners: Female  ?Other Topics Concern  ? Not on file  ?Social History Narrative  ? Reg exercise--- no  ? ?Social Determinants of Health  ? ?Financial Resource Strain: Not on file  ?Food Insecurity: Not on file  ?Transportation Needs: Not on file  ?Physical Activity: Not on file  ?Stress: Not on file  ?Social Connections: Not on file  ?Intimate Partner Violence: Not on file  ? ? ?Outpatient Medications Prior to Visit  ?Medication Sig Dispense Refill  ? aspirin 81 MG tablet Take 81 mg by mouth daily.    ? Flaxseed, Linseed, (FLAXSEED OIL) 1000 MG CAPS Take by mouth 2 (two) times daily.    ? meloxicam (MOBIC) 15 MG tablet Take 1 tablet (15 mg total) by mouth as needed for  pain. 90 tablet 1  ? Multiple Vitamin (MULTIVITAMIN) tablet Take 1 tablet by mouth daily.    ? NON FORMULARY at bedtime. CPAP    ? metoprolol succinate (TOPROL-XL) 100 MG 24 hr tablet TAKE 1 TABLET DAILY 90 tablet 3  ? niacin (NIASPAN) 1000 MG CR tablet TAKE 1 TABLET DAILY 90 tablet 3  ? simvastatin (ZOCOR) 40 MG tablet TAKE 1 TABLET AT BEDTIME 90 tablet 3  ? ?No facility-administered medications prior to visit.  ? ? ?No Known Allergies ? ?Review of Systems  ?Constitutional:  Negative for fever.  ?HENT:  Negative for congestion, sinus pain and sore throat.   ?Respiratory:  Negative for cough, shortness of breath and wheezing.   ?Cardiovascular:  Negative for chest pain and palpitations.  ?Gastrointestinal:  Negative for blood in stool, constipation, diarrhea, nausea and vomiting.  ?Genitourinary:  Negative for dysuria, frequency and hematuria.  ?Musculoskeletal:  Negative for joint pain and myalgias.  ?Skin:   ?     (-)New moles  ?Neurological:  Negative for headaches.  ? ?   ?Objective:  ?  ?Physical Exam ?Constitutional:   ?   General: He is not in acute distress. ?   Appearance: Normal appearance. He is not ill-appearing.  ?HENT:  ?   Head: Normocephalic and atraumatic.  ?   Right Ear: Tympanic membrane, ear canal and external ear normal.  ?   Left Ear: Tympanic membrane, ear canal and external ear normal.  ?Eyes:  ?   Extraocular Movements: Extraocular movements intact.  ?   Pupils: Pupils are equal, round, and reactive to light.  ?Cardiovascular:  ?   Rate and Rhythm: Normal rate and regular rhythm.  ?   Heart sounds: Normal heart sounds. No murmur heard. ?  No gallop.  ?   Comments: Blood pressure measured 116/78 during recheck  ?Pulmonary:  ?   Effort: Pulmonary effort is normal. No respiratory distress.  ?   Breath sounds: Normal breath sounds. No wheezing or rales.  ?Abdominal:  ?   General: Bowel sounds are normal. There is no distension.  ?   Palpations: Abdomen is soft.  ?   Tenderness: There is no  abdominal tenderness. There is no guarding.  ?Skin: ?   General: Skin is warm and dry.  ?Neurological:  ?   Mental Status: He is alert  and oriented to person, place, and time.  ?Psychiatric:     ?   Judgment: Judgment normal.  ? ? ?BP 116/78   Pulse (!) 52   Temp 97.8 ?F (36.6 ?C) (Oral)   Resp 18   Ht '5\' 10"'$  (1.778 m)   Wt 242 lb (109.8 kg)   SpO2 97%   BMI 34.72 kg/m?  ?Wt Readings from Last 3 Encounters:  ?08/08/21 242 lb (109.8 kg)  ?06/27/21 241 lb 12.8 oz (109.7 kg)  ?12/03/20 243 lb 3.2 oz (110.3 kg)  ? ? ?Diabetic Foot Exam - Simple   ?No data filed ?  ? ?Lab Results  ?Component Value Date  ? WBC 7.4 05/21/2020  ? HGB 15.2 05/21/2020  ? HCT 43.5 05/21/2020  ? PLT 184.0 05/21/2020  ? GLUCOSE 94 12/03/2020  ? CHOL 131 12/03/2020  ? TRIG 155.0 (H) 12/03/2020  ? HDL 43.10 12/03/2020  ? [place] 56 12/03/2020  ? ALT 27 12/03/2020  ? AST 30 12/03/2020  ? NA 140 12/03/2020  ? K 4.6 12/03/2020  ? CL 103 12/03/2020  ? CREATININE 0.77 12/03/2020  ? BUN 16 12/03/2020  ? CO2 29 12/03/2020  ? TSH 2.10 05/21/2020  ? PSA 1.5 03/18/2016  ? HGBA1C 4.9 12/03/2017  ? MICROALBUR <0.7 05/21/2020  ? ? ?Lab Results  ?Component Value Date  ? TSH 2.10 05/21/2020  ? ?Lab Results  ?Component Value Date  ? WBC 7.4 05/21/2020  ? HGB 15.2 05/21/2020  ? HCT 43.5 05/21/2020  ? MCV 93.5 05/21/2020  ? PLT 184.0 05/21/2020  ? ?Lab Results  ?Component Value Date  ? NA 140 12/03/2020  ? K 4.6 12/03/2020  ? CO2 29 12/03/2020  ? GLUCOSE 94 12/03/2020  ? BUN 16 12/03/2020  ? CREATININE 0.77 12/03/2020  ? BILITOT 1.6 (H) 12/03/2020  ? ALKPHOS 54 12/03/2020  ? AST 30 12/03/2020  ? ALT 27 12/03/2020  ? PROT 7.0 12/03/2020  ? ALBUMIN 4.7 12/03/2020  ? CALCIUM 9.7 12/03/2020  ? GFR 96.89 12/03/2020  ? ?Lab Results  ?Component Value Date  ? CHOL 131 12/03/2020  ? ?Lab Results  ?Component Value Date  ? HDL 43.10 12/03/2020  ? ?Lab Results  ?Component Value Date  ? [place] 56 12/03/2020  ? ?Lab Results  ?Component Value Date  ? TRIG 155.0 (H)  12/03/2020  ? ?Lab Results  ?Component Value Date  ? CHOLHDL 3 12/03/2020  ? ?Lab Results  ?Component Value Date  ? HGBA1C 4.9 12/03/2017  ? ?Colonoscopy- Last completed 07/12/2013. Results showed small patched of abnormal mucos

## 2021-08-09 ENCOUNTER — Encounter: Payer: Self-pay | Admitting: Family Medicine

## 2021-08-09 LAB — LIPID PANEL
Cholesterol: 124 mg/dL (ref 0–200)
HDL: 44.2 mg/dL (ref >39.00)
LDL Cholesterol: 51 mg/dL (ref 0–99)
NonHDL: 80.29
Total CHOL/HDL Ratio: 3
Triglycerides: 144 mg/dL (ref 0.0–149.0)
VLDL: 28.8 mg/dL (ref 0.0–40.0)

## 2021-08-09 LAB — COMPREHENSIVE METABOLIC PANEL
ALT: 27 U/L (ref 0–53)
AST: 29 U/L (ref 0–37)
Albumin: 4.6 g/dL (ref 3.5–5.2)
Alkaline Phosphatase: 52 U/L (ref 39–117)
BUN: 15 mg/dL (ref 6–23)
CO2: 30 mEq/L (ref 19–32)
Calcium: 9.7 mg/dL (ref 8.4–10.5)
Chloride: 105 mEq/L (ref 96–112)
Creatinine, Ser: 0.79 mg/dL (ref 0.40–1.50)
GFR: 95.68 mL/min (ref >60.00)
Glucose, Bld: 95 mg/dL (ref 70–99)
Potassium: 4.7 mEq/L (ref 3.5–5.1)
Sodium: 141 mEq/L (ref 135–145)
Total Bilirubin: 1.3 mg/dL — ABNORMAL HIGH (ref 0.2–1.2)
Total Protein: 6.5 g/dL (ref 6.0–8.3)

## 2021-08-09 LAB — CBC WITH DIFFERENTIAL/PLATELET
Basophils Absolute: 0.1 10*3/uL (ref 0.0–0.1)
Basophils Relative: 1.4 % (ref 0.0–3.0)
Eosinophils Absolute: 0.2 10*3/uL (ref 0.0–0.7)
Eosinophils Relative: 2.7 % (ref 0.0–5.0)
HCT: 41.6 % (ref 39.0–52.0)
Hemoglobin: 14.6 g/dL (ref 13.0–17.0)
Lymphocytes Relative: 23.7 % (ref 12.0–46.0)
Lymphs Abs: 1.8 10*3/uL (ref 0.7–4.0)
MCHC: 35 g/dL (ref 30.0–36.0)
MCV: 94.1 fl (ref 78.0–100.0)
Monocytes Absolute: 0.6 10*3/uL (ref 0.1–1.0)
Monocytes Relative: 8.2 % (ref 3.0–12.0)
Neutro Abs: 4.9 10*3/uL (ref 1.4–7.7)
Neutrophils Relative %: 64 % (ref 43.0–77.0)
Platelets: 163 10*3/uL (ref 150.0–400.0)
RBC: 4.42 Mil/uL (ref 4.22–5.81)
RDW: 12.7 % (ref 11.5–15.5)
WBC: 7.7 10*3/uL (ref 4.0–10.5)

## 2021-08-09 LAB — TSH: TSH: 1.7 u[IU]/mL (ref 0.35–5.50)

## 2021-08-09 LAB — PSA: PSA: 2.29 ng/mL (ref 0.10–4.00)

## 2021-08-09 NOTE — Assessment & Plan Note (Signed)
Encourage heart healthy diet such as MIND or DASH diet, increase exercise, avoid trans fats, simple carbohydrates and processed foods, consider a krill or fish or flaxseed oil cap daily.  °

## 2021-08-09 NOTE — Assessment & Plan Note (Signed)
High today--- repeat 116/78  Well controlled, no changes to meds. Encouraged heart healthy diet such as the DASH diet and exercise as tolerated.  ?

## 2021-08-09 NOTE — Assessment & Plan Note (Signed)
ghm utd Check labs  See avs  

## 2021-11-26 ENCOUNTER — Other Ambulatory Visit: Payer: Self-pay | Admitting: Family Medicine

## 2022-05-25 ENCOUNTER — Other Ambulatory Visit: Payer: Self-pay | Admitting: Family Medicine

## 2022-05-26 ENCOUNTER — Encounter: Payer: Self-pay | Admitting: *Deleted

## 2022-06-01 ENCOUNTER — Other Ambulatory Visit: Payer: Self-pay | Admitting: Family Medicine

## 2022-08-18 ENCOUNTER — Other Ambulatory Visit: Payer: Self-pay | Admitting: Family Medicine

## 2022-08-25 ENCOUNTER — Emergency Department (HOSPITAL_BASED_OUTPATIENT_CLINIC_OR_DEPARTMENT_OTHER)
Admission: EM | Admit: 2022-08-25 | Discharge: 2022-08-25 | Disposition: A | Discharge status: Home / Self Care | Payer: BC Managed Care – PPO | Attending: Emergency Medicine | Admitting: Emergency Medicine

## 2022-08-25 ENCOUNTER — Encounter (HOSPITAL_BASED_OUTPATIENT_CLINIC_OR_DEPARTMENT_OTHER): Payer: Self-pay | Admitting: Emergency Medicine

## 2022-08-25 DIAGNOSIS — Z7982 Long term (current) use of aspirin: Secondary | ICD-10-CM | POA: Diagnosis not present

## 2022-08-25 DIAGNOSIS — R040 Epistaxis: Secondary | ICD-10-CM

## 2022-08-25 MED ORDER — OXYMETAZOLINE HCL 0.05 % NA SOLN
1.0000 | Freq: Once | NASAL | Status: AC
  Administered 2022-08-25: 1 via NASAL
  Filled 2022-08-25: qty 30

## 2022-08-25 MED ORDER — LIDOCAINE-EPINEPHRINE (PF) 2 %-1:200000 IJ SOLN
10.0000 mL | Freq: Once | INTRAMUSCULAR | Status: AC
  Administered 2022-08-25: 10 mL via INTRADERMAL
  Filled 2022-08-25: qty 20

## 2022-08-25 NOTE — Discharge Instructions (Signed)
I will let you take the nasal medicine home.  Try not to blow or rub your nose for at least the next couple days.  You can apply a small amount of Vaseline or antibiotic ointment to the tip of your pinky and rub it just on the inside of each nostril prior to going to bed.  Sometimes a humidifier can help as well.  If you do have recurrent bleeding then try and hold direct pressure for 15 minutes without stopping.  If this does not work then blow your nose and do 2 squirts of the medication into both sides and try again.  If that does not work then please return to the emergency department for evaluation.  I have given you information for the ear nose and throat physician that is on-call.  Please call them if you would like to have them further evaluate you for this problem.

## 2022-08-25 NOTE — ED Triage Notes (Signed)
Patient presents with nosebleed onset 20 min ago. Denies blood thinners, 81 ASA only.

## 2022-08-25 NOTE — ED Provider Notes (Signed)
[facility] Provider Note   CSN: [id number] Arrival date & time: 08/25/22  1203     History  Chief Complaint  Patient presents with   Epistaxis    Raymond Howell is a 62 y.o. male.  62 yo M with a chief complaint of a nosebleed.  This has been a problem in his life off and on.  He has required cautery at some time in his life.  He denies any recent injury.  Feels like about an hour ago he started having some bleeding.  Has not been able to get it to stop.   Epistaxis      Home Medications Prior to Admission medications   Medication Sig Start Date End Date Taking? Authorizing Provider  aspirin 81 MG tablet Take 81 mg by mouth daily.    [provider]  Flaxseed, Linseed, (FLAXSEED OIL) 1000 MG CAPS Take by mouth 2 (two) times daily.    [provider]  meloxicam (MOBIC) 15 MG tablet Take 1 tablet (15 mg total) by mouth daily as needed for pain. 06/02/22   Donato Schultz, DO  metoprolol succinate (TOPROL-XL) 100 MG 24 hr tablet Take 1 tablet (100 mg total) by mouth daily. Take with or immediately following a meal. 08/08/21   Zola Button, Grayling Congress, DO  Multiple Vitamin (MULTIVITAMIN) tablet Take 1 tablet by mouth daily.    [provider]  niacin (NIASPAN) 1000 MG CR tablet Take 1 tablet (1,000 mg total) by mouth daily. 08/08/21   Donato Schultz, DO  NON FORMULARY at bedtime. CPAP    [provider]  simvastatin (ZOCOR) 40 MG tablet Take 1 tablet (40 mg total) by mouth at bedtime. 08/08/21   Donato Schultz, DO      Allergies    Patient has no known allergies.    Review of Systems   Review of Systems  HENT:  Positive for nosebleeds.     Physical Exam Updated Vital Signs BP (!) 166/44 (BP Location: Left Arm)   Pulse (!) 57   Resp 18   Ht  (1.753 m)   Wt 108.9 kg   SpO2 100%   BMI 35.44 kg/m  Physical Exam Vitals and nursing note reviewed.  Constitutional:       Appearance: He is well-developed.  HENT:     Head: Normocephalic and atraumatic.     Nose:     Comments: There is some very minimal of any increased vascularity to the right Kiesselbach's plexus.  No obvious signs of bleeding. Eyes:     Pupils: Pupils are equal, round, and reactive to light.  Neck:     Vascular: No JVD.  Cardiovascular:     Rate and Rhythm: Normal rate and regular rhythm.     Heart sounds: No murmur heard.    No friction rub. No gallop.  Pulmonary:     Effort: No respiratory distress.     Breath sounds: No wheezing.  Abdominal:     General: There is no distension.     Tenderness: There is no abdominal tenderness. There is no guarding or rebound.  Musculoskeletal:        General: Normal range of motion.     Cervical back: Normal range of motion and neck supple.  Skin:    Coloration: Skin is not pale.     Findings: No rash.  Neurological:     Mental Status: He is alert  and oriented to person, place, and time.  Psychiatric:        Behavior: Behavior normal.     ED Results / Procedures / Treatments   Labs (all labs ordered are listed, but only abnormal results are displayed) Labs Reviewed - No data to display  EKG None  Radiology No results found.  Procedures .Epistaxis Management  Date/Time: 08/25/2022 1:07 PM  Performed by: Melene Plan, DO Authorized by: Melene Plan, DO   Consent:    Consent obtained:  Verbal   Consent given by:  Patient   Risks, benefits, and alternatives were discussed: yes     Risks discussed:  Bleeding, infection and nasal injury   Alternatives discussed:  Delayed treatment Universal protocol:    Patient identity confirmed:  Verbally with patient Procedure details:    Treatment site:  R anterior   Treatment complexity:  Limited   Treatment episode: initial   Post-procedure details:    Assessment:  Bleeding stopped   Procedure completion:  Tolerated well, no immediate complications Comments:     Lidocaine with  epinephrine and Afrin applied topically with direct pressure for 15 minutes with resolution.     Medications Ordered in ED Medications  oxymetazoline (AFRIN) 0.05 % nasal spray 1 spray (1 spray Each Nare Given by Other 08/25/22 1224)  lidocaine-EPINEPHrine (XYLOCAINE W/EPI) 2 %-1:200000 (PF) injection 10 mL (10 mLs Intradermal Given by Other 08/25/22 1224)    ED Course/ Medical Decision Making/ A&P                             Medical Decision Making Risk OTC drugs. Prescription drug management.   62 yo M with a chief complaint of right-sided epistaxis.  Controlled with direct pressure and topical therapy.  Was monitored in the ER for about 30 minutes post.  Ambulating and no recurrent bleeding.  Will discharge home.  ENT follow-up.  1:08 PM:  I have discussed the diagnosis/risks/treatment options with the patient and family.  Evaluation and diagnostic testing in the emergency department does not suggest an emergent condition requiring admission or immediate intervention beyond what has been performed at this time.  They will follow up with ENT. We also discussed returning to the ED immediately if new or worsening sx occur. We discussed the sx which are most concerning (e.g., sudden worsening pain, fever, inability to tolerate by mouth) that necessitate immediate return. Medications administered to the patient during their visit and any new prescriptions provided to the patient are listed below.  Medications given during this visit Medications  oxymetazoline (AFRIN) 0.05 % nasal spray 1 spray (1 spray Each Nare Given by Other 08/25/22 1224)  lidocaine-EPINEPHrine (XYLOCAINE W/EPI) 2 %-1:200000 (PF) injection 10 mL (10 mLs Intradermal Given by Other 08/25/22 1224)     The patient appears reasonably screen and/or stabilized for discharge and I doubt any other medical condition or other [facility] requiring further screening, evaluation, or treatment in the ED at this time prior to discharge.           Final Clinical Impression(s) / ED Diagnoses Final diagnoses:  Right-sided epistaxis    Rx / DC Orders ED Discharge Orders     None         Melene Plan, DO 08/25/22 1308

## 2022-10-19 ENCOUNTER — Other Ambulatory Visit: Payer: Self-pay | Admitting: Family Medicine

## 2022-10-19 DIAGNOSIS — I10 Essential (primary) hypertension: Secondary | ICD-10-CM

## 2022-10-19 DIAGNOSIS — E785 Hyperlipidemia, unspecified: Secondary | ICD-10-CM

## 2022-10-20 ENCOUNTER — Encounter: Payer: Self-pay | Admitting: *Deleted
